# Patient Record
Sex: Female | Born: 1950 | Race: White | Hispanic: No | Marital: Married | Smoking: Never smoker
Health system: Southern US, Community
[De-identification: ages and names within clinical notes are randomized; demographics above are authoritative.]

## PROBLEM LIST (undated history)

## (undated) DIAGNOSIS — K635 Polyp of colon: Secondary | ICD-10-CM

## (undated) DIAGNOSIS — E785 Hyperlipidemia, unspecified: Secondary | ICD-10-CM

## (undated) DIAGNOSIS — I10 Essential (primary) hypertension: Secondary | ICD-10-CM

## (undated) DIAGNOSIS — Z8719 Personal history of other diseases of the digestive system: Secondary | ICD-10-CM

## (undated) DIAGNOSIS — J309 Allergic rhinitis, unspecified: Secondary | ICD-10-CM

## (undated) DIAGNOSIS — E559 Vitamin D deficiency, unspecified: Secondary | ICD-10-CM

## (undated) DIAGNOSIS — N950 Postmenopausal bleeding: Secondary | ICD-10-CM

## (undated) DIAGNOSIS — M069 Rheumatoid arthritis, unspecified: Secondary | ICD-10-CM

## (undated) DIAGNOSIS — N6099 Unspecified benign mammary dysplasia of unspecified breast: Secondary | ICD-10-CM

## (undated) DIAGNOSIS — K219 Gastro-esophageal reflux disease without esophagitis: Secondary | ICD-10-CM

## (undated) HISTORY — PX: HYSTEROSCOPY: SHX211

## (undated) HISTORY — PX: BREAST CYST ASPIRATION: SHX578

## (undated) HISTORY — PX: APPENDECTOMY: SHX54

## (undated) HISTORY — PX: TONSILLECTOMY: SUR1361

## (undated) HISTORY — DX: Unspecified benign mammary dysplasia of unspecified breast: N60.99

---

## 2005-11-02 ENCOUNTER — Ambulatory Visit: Payer: Self-pay | Admitting: Unknown Physician Specialty

## 2006-03-15 ENCOUNTER — Ambulatory Visit: Payer: Self-pay | Admitting: Gastroenterology

## 2006-05-15 ENCOUNTER — Ambulatory Visit: Payer: Self-pay | Admitting: Unknown Physician Specialty

## 2006-07-05 ENCOUNTER — Ambulatory Visit: Payer: Self-pay | Admitting: Gastroenterology

## 2006-11-18 ENCOUNTER — Ambulatory Visit: Payer: Self-pay | Admitting: Unknown Physician Specialty

## 2006-11-21 ENCOUNTER — Ambulatory Visit: Payer: Self-pay | Admitting: Unknown Physician Specialty

## 2007-01-14 ENCOUNTER — Ambulatory Visit: Payer: Self-pay | Admitting: Unknown Physician Specialty

## 2007-06-27 ENCOUNTER — Ambulatory Visit: Payer: Self-pay | Admitting: Unknown Physician Specialty

## 2007-10-01 ENCOUNTER — Ambulatory Visit: Payer: Self-pay | Admitting: Unknown Physician Specialty

## 2008-01-22 ENCOUNTER — Ambulatory Visit: Payer: Self-pay | Admitting: Unknown Physician Specialty

## 2009-01-31 ENCOUNTER — Ambulatory Visit: Payer: Self-pay | Admitting: Unknown Physician Specialty

## 2009-02-02 ENCOUNTER — Ambulatory Visit: Payer: Self-pay | Admitting: Unknown Physician Specialty

## 2009-08-08 ENCOUNTER — Ambulatory Visit: Payer: Self-pay | Admitting: Unknown Physician Specialty

## 2010-02-06 ENCOUNTER — Ambulatory Visit: Payer: Self-pay | Admitting: Unknown Physician Specialty

## 2010-11-29 ENCOUNTER — Ambulatory Visit: Payer: Self-pay | Admitting: Gastroenterology

## 2011-03-05 ENCOUNTER — Ambulatory Visit: Payer: Self-pay | Admitting: Unknown Physician Specialty

## 2011-07-26 ENCOUNTER — Ambulatory Visit: Payer: Self-pay | Admitting: Gastroenterology

## 2012-07-03 ENCOUNTER — Ambulatory Visit: Payer: Self-pay

## 2012-09-19 ENCOUNTER — Ambulatory Visit: Payer: Self-pay | Admitting: Gastroenterology

## 2012-09-23 LAB — PATHOLOGY REPORT

## 2013-08-19 ENCOUNTER — Ambulatory Visit: Payer: Self-pay

## 2013-08-26 ENCOUNTER — Ambulatory Visit: Payer: Self-pay

## 2014-08-16 ENCOUNTER — Other Ambulatory Visit: Payer: Self-pay | Admitting: Infectious Diseases

## 2014-08-16 DIAGNOSIS — Z1231 Encounter for screening mammogram for malignant neoplasm of breast: Secondary | ICD-10-CM

## 2014-08-24 ENCOUNTER — Ambulatory Visit
Admission: RE | Admit: 2014-08-24 | Discharge: 2014-08-24 | Disposition: A | Discharge status: Home / Self Care | Payer: BC Managed Care – PPO | Source: Ambulatory Visit | Attending: Infectious Diseases | Admitting: Infectious Diseases

## 2014-08-24 DIAGNOSIS — Z1231 Encounter for screening mammogram for malignant neoplasm of breast: Secondary | ICD-10-CM | POA: Diagnosis present

## 2015-09-12 ENCOUNTER — Other Ambulatory Visit: Payer: Self-pay | Admitting: Obstetrics and Gynecology

## 2015-09-12 DIAGNOSIS — Z1231 Encounter for screening mammogram for malignant neoplasm of breast: Secondary | ICD-10-CM

## 2015-09-30 ENCOUNTER — Other Ambulatory Visit: Payer: Self-pay | Admitting: Obstetrics and Gynecology

## 2015-09-30 ENCOUNTER — Ambulatory Visit
Admission: RE | Admit: 2015-09-30 | Discharge: 2015-09-30 | Disposition: A | Discharge status: Home / Self Care | Payer: BC Managed Care – PPO | Source: Ambulatory Visit | Attending: Obstetrics and Gynecology | Admitting: Obstetrics and Gynecology

## 2015-09-30 DIAGNOSIS — Z1231 Encounter for screening mammogram for malignant neoplasm of breast: Secondary | ICD-10-CM | POA: Diagnosis present

## 2015-10-12 ENCOUNTER — Other Ambulatory Visit: Payer: Self-pay | Admitting: Gastroenterology

## 2015-10-12 DIAGNOSIS — R1310 Dysphagia, unspecified: Secondary | ICD-10-CM

## 2015-10-21 ENCOUNTER — Ambulatory Visit
Admission: RE | Admit: 2015-10-21 | Discharge: 2015-10-21 | Disposition: A | Discharge status: Home / Self Care | Payer: BC Managed Care – PPO | Source: Ambulatory Visit | Attending: Gastroenterology | Admitting: Gastroenterology

## 2015-10-21 DIAGNOSIS — R1310 Dysphagia, unspecified: Secondary | ICD-10-CM | POA: Diagnosis present

## 2015-10-21 DIAGNOSIS — K449 Diaphragmatic hernia without obstruction or gangrene: Secondary | ICD-10-CM | POA: Insufficient documentation

## 2015-10-21 DIAGNOSIS — K225 Diverticulum of esophagus, acquired: Secondary | ICD-10-CM | POA: Diagnosis not present

## 2015-10-21 DIAGNOSIS — K219 Gastro-esophageal reflux disease without esophagitis: Secondary | ICD-10-CM | POA: Insufficient documentation

## 2015-12-16 ENCOUNTER — Encounter: Payer: Self-pay | Admitting: *Deleted

## 2015-12-16 ENCOUNTER — Ambulatory Visit: Payer: BC Managed Care – PPO | Admitting: Anesthesiology

## 2015-12-16 ENCOUNTER — Encounter
Admission: RE | Disposition: A | Discharge status: Home / Self Care | Payer: Self-pay | Source: Ambulatory Visit | Attending: Gastroenterology

## 2015-12-16 ENCOUNTER — Ambulatory Visit
Admission: RE | Admit: 2015-12-16 | Discharge: 2015-12-16 | Disposition: A | Discharge status: Home / Self Care | Payer: BC Managed Care – PPO | Source: Ambulatory Visit | Attending: Gastroenterology | Admitting: Gastroenterology

## 2015-12-16 DIAGNOSIS — N6019 Diffuse cystic mastopathy of unspecified breast: Secondary | ICD-10-CM | POA: Insufficient documentation

## 2015-12-16 DIAGNOSIS — M069 Rheumatoid arthritis, unspecified: Secondary | ICD-10-CM | POA: Insufficient documentation

## 2015-12-16 DIAGNOSIS — K5730 Diverticulosis of large intestine without perforation or abscess without bleeding: Secondary | ICD-10-CM | POA: Insufficient documentation

## 2015-12-16 DIAGNOSIS — K449 Diaphragmatic hernia without obstruction or gangrene: Secondary | ICD-10-CM | POA: Insufficient documentation

## 2015-12-16 DIAGNOSIS — K225 Diverticulum of esophagus, acquired: Secondary | ICD-10-CM | POA: Diagnosis not present

## 2015-12-16 DIAGNOSIS — K219 Gastro-esophageal reflux disease without esophagitis: Secondary | ICD-10-CM | POA: Insufficient documentation

## 2015-12-16 DIAGNOSIS — D125 Benign neoplasm of sigmoid colon: Secondary | ICD-10-CM | POA: Diagnosis not present

## 2015-12-16 DIAGNOSIS — R1310 Dysphagia, unspecified: Secondary | ICD-10-CM | POA: Diagnosis present

## 2015-12-16 DIAGNOSIS — J309 Allergic rhinitis, unspecified: Secondary | ICD-10-CM | POA: Diagnosis not present

## 2015-12-16 DIAGNOSIS — K640 First degree hemorrhoids: Secondary | ICD-10-CM | POA: Diagnosis not present

## 2015-12-16 DIAGNOSIS — E559 Vitamin D deficiency, unspecified: Secondary | ICD-10-CM | POA: Insufficient documentation

## 2015-12-16 DIAGNOSIS — I10 Essential (primary) hypertension: Secondary | ICD-10-CM | POA: Insufficient documentation

## 2015-12-16 DIAGNOSIS — Z86010 Personal history of colonic polyps: Secondary | ICD-10-CM | POA: Insufficient documentation

## 2015-12-16 DIAGNOSIS — K317 Polyp of stomach and duodenum: Secondary | ICD-10-CM | POA: Insufficient documentation

## 2015-12-16 DIAGNOSIS — K2950 Unspecified chronic gastritis without bleeding: Secondary | ICD-10-CM | POA: Insufficient documentation

## 2015-12-16 DIAGNOSIS — N950 Postmenopausal bleeding: Secondary | ICD-10-CM | POA: Insufficient documentation

## 2015-12-16 DIAGNOSIS — E785 Hyperlipidemia, unspecified: Secondary | ICD-10-CM | POA: Diagnosis not present

## 2015-12-16 HISTORY — DX: Vitamin D deficiency, unspecified: E55.9

## 2015-12-16 HISTORY — PX: COLONOSCOPY WITH PROPOFOL: SHX5780

## 2015-12-16 HISTORY — PX: ESOPHAGOGASTRODUODENOSCOPY (EGD) WITH PROPOFOL: SHX5813

## 2015-12-16 HISTORY — DX: Allergic rhinitis, unspecified: J30.9

## 2015-12-16 HISTORY — DX: Essential (primary) hypertension: I10

## 2015-12-16 HISTORY — DX: Rheumatoid arthritis, unspecified: M06.9

## 2015-12-16 HISTORY — DX: Polyp of colon: K63.5

## 2015-12-16 HISTORY — DX: Postmenopausal bleeding: N95.0

## 2015-12-16 HISTORY — DX: Hyperlipidemia, unspecified: E78.5

## 2015-12-16 SURGERY — ESOPHAGOGASTRODUODENOSCOPY (EGD) WITH PROPOFOL
Anesthesia: General

## 2015-12-16 MED ORDER — SODIUM CHLORIDE 0.9 % IV SOLN
INTRAVENOUS | Status: DC
  Administered 2015-12-16: 14:00:00 via INTRAVENOUS
  Administered 2015-12-16: 1000 mL via INTRAVENOUS

## 2015-12-16 MED ORDER — MIDAZOLAM HCL 5 MG/5ML IJ SOLN
INTRAMUSCULAR | Status: DC | PRN
  Administered 2015-12-16: 1 mg via INTRAVENOUS

## 2015-12-16 MED ORDER — PROPOFOL 10 MG/ML IV BOLUS
INTRAVENOUS | Status: DC | PRN
  Administered 2015-12-16: 80 mg via INTRAVENOUS

## 2015-12-16 MED ORDER — PHENYLEPHRINE HCL 10 MG/ML IJ SOLN
INTRAMUSCULAR | Status: DC | PRN
  Administered 2015-12-16 (×2): 100 ug via INTRAVENOUS

## 2015-12-16 MED ORDER — LIDOCAINE HCL (CARDIAC) 20 MG/ML IV SOLN
INTRAVENOUS | Status: DC | PRN
  Administered 2015-12-16: 100 mg via INTRAVENOUS

## 2015-12-16 MED ORDER — SODIUM CHLORIDE 0.9 % IV SOLN: INTRAVENOUS | Status: DC

## 2015-12-16 MED ORDER — PROPOFOL 500 MG/50ML IV EMUL
INTRAVENOUS | Status: DC | PRN
  Administered 2015-12-16: 180 ug/kg/min via INTRAVENOUS

## 2015-12-16 MED ORDER — FENTANYL CITRATE (PF) 100 MCG/2ML IJ SOLN
INTRAMUSCULAR | Status: DC | PRN
  Administered 2015-12-16: 50 ug via INTRAVENOUS

## 2015-12-16 NOTE — Op Note (Signed)
[facility] Gastroenterology Patient Name: Annette Bruce Procedure Date: 12/16/2015 2:04 PM MRN: [id number] Account #: [id number] Date of Birth: 12/14/1950 Admit Type: Outpatient Age: 65 Room: [facility] ENDO ROOM 4 Gender: Female Note Status: Finalized Procedure:            Upper GI endoscopy Indications:          Dysphagia Providers:            Lollie Sails, MD Referring MD:         Adrian Prows (Referring MD) Medicines:            Monitored Anesthesia Care Complications:        No immediate complications. Procedure:            Pre-Anesthesia Assessment:                       - ASA Grade Assessment: II - A patient with mild                        systemic disease.                       After obtaining informed consent, the endoscope was                        passed under direct vision. Throughout the procedure,                        the patient's blood pressure, pulse, and oxygen                        saturations were monitored continuously. The Endoscope                        was introduced through the mouth, and advanced to the                        fourth part of duodenum. The upper GI endoscopy was                        accomplished without difficulty. The patient tolerated                        the procedure well. 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      The Z-line was irregular. Biopsies were taken with a cold forceps for       histology.      A small hiatal hernia was present.      Patchy mild inflammation characterized by congestion (edema), erosions       and erythema was found in the gastric body and in the gastric antrum.       Biopsies were taken with a cold forceps for histology.      Multiple 1 to 4 mm sessile polyps with no bleeding and no stigmata of       recent bleeding were found in the gastric body. Biopsies were taken  with       a cold forceps for histology.      Patchy minimal inflammation characterized by  granularity was found in       the duodenal bulb. Impression:           - Zenker's diverticulum.                       - Z-line irregular. Biopsied.                       - Small hiatal hernia.                       - Gastritis. Biopsied.                       - Multiple gastric polyps. Biopsied. Recommendation:       - Continue present medications.                       - Return to GI clinic in 1 month. Procedure Code(s):    --- Professional ---                       [phone], Esophagogastroduodenoscopy, flexible, transoral;                        with biopsy, single or multiple Diagnosis Code(s):    --- Professional ---                       K22.5, Diverticulum of esophagus, acquired                       K22.8, Other specified diseases of esophagus                       K44.9, Diaphragmatic hernia without obstruction or                        gangrene                       K29.70, Gastritis, unspecified, without bleeding                       K31.7, Polyp of stomach and duodenum                       R13.10, Dysphagia, unspecified CPT copyright 2016 American Medical Association. All rights reserved. The codes documented in this report are preliminary and upon coder review may  be revised to meet current compliance requirements. Lollie Sails, MD 12/16/2015 2:34:51 PM This report has been signed electronically. Number of Addenda: 0 Note Initiated On: 12/16/2015 2:04 PM      [facility]

## 2015-12-16 NOTE — Transfer of Care (Signed)
Immediate Anesthesia Transfer of Care Note  Patient: Annette Bruce  Procedure(s) Performed: Procedure(s): ESOPHAGOGASTRODUODENOSCOPY (EGD) WITH PROPOFOL (N/A) COLONOSCOPY WITH PROPOFOL (N/A)  Patient Location: PACU  Anesthesia Type:General  Level of Consciousness: awake  Airway & Oxygen Therapy: Patient Spontanous Breathing and Patient connected to nasal cannula oxygen  Post-op Assessment: Report given to RN and Post -op Vital signs reviewed and stable  Post vital signs: Reviewed and stable  Last Vitals:  Vitals:   12/16/15 1319 12/16/15 1505  BP: (!) 147/75 (!) 92/50  Pulse: 71 68  Resp: 18 17  Temp: 36.8 C (!) 35.8 C    Last Pain:  Vitals:   12/16/15 1505  TempSrc: Tympanic  PainSc: Asleep         Complications: No apparent anesthesia complications

## 2015-12-16 NOTE — Op Note (Signed)
[facility] Gastroenterology Patient Name: Annette Bruce Procedure Date: 12/16/2015 1:58 PM MRN: [id number] Account #: [id number] Date of Birth: 1951-03-02 Admit Type: Outpatient Age: 65 Room: [facility] ENDO ROOM 4 Gender: Female Note Status: Finalized Procedure:            Colonoscopy Indications:          Personal history of colonic polyps Providers:            Lollie Sails, MD Referring MD:         Adrian Prows (Referring MD) Medicines:            Monitored Anesthesia Care Complications:        No immediate complications. Procedure:            Pre-Anesthesia Assessment:                       - ASA Grade Assessment: II - A patient with mild                        systemic disease.                       After obtaining informed consent, the colonoscope was                        passed under direct vision. Throughout the procedure,                        the patient's blood pressure, pulse, and oxygen                        saturations were monitored continuously. The                        Colonoscope was introduced through the anus and                        advanced to the the cecum, identified by appendiceal                        orifice and ileocecal valve. The colonoscopy was                        performed without difficulty. The patient tolerated the                        procedure well. The quality of the bowel preparation                        was good. Findings:      A few small-mouthed diverticula were found in the sigmoid colon,       descending colon, transverse colon and ascending colon.      Two sessile polyps were found in the mid sigmoid colon. The polyps were       less than 1 mm in size. These polyps were removed with a cold biopsy       forceps. Resection and retrieval were complete.      A tattoo was seen in the descending colon. The tattoo site appeared       normal.      The retroflexed view of the  distal rectum and anal verge  was normal and       showed no anal or rectal abnormalities.      The digital rectal exam was normal.      Non-bleeding internal hemorrhoids were found during anoscopy. The       hemorrhoids were small and Grade I (internal hemorrhoids that do not       prolapse). Impression:           - Diverticulosis in the sigmoid colon, in the                        descending colon, in the transverse colon and in the                        ascending colon.                       - Two less than 1 mm polyps in the mid sigmoid colon,                        removed with a cold biopsy forceps. Resected and                        retrieved.                       - A tattoo was seen in the descending colon. The tattoo                        site appeared normal.                       - The distal rectum and anal verge are normal on                        retroflexion view. Recommendation:       - Discharge patient to home.                       - Await pathology results.                       - Telephone GI clinic for pathology results in 1 week.                       - Discharge patient to home. Procedure Code(s):    --- Professional ---                       [phone], Colonoscopy, flexible; with biopsy, single or                        multiple Diagnosis Code(s):    --- Professional ---                       D12.5, Benign neoplasm of sigmoid colon                       Z86.010, Personal history of colonic polyps                       K57.30, Diverticulosis of large intestine without  perforation or abscess without bleeding CPT copyright 2016 American Medical Association. All rights reserved. The codes documented in this report are preliminary and upon coder review may  be revised to meet current compliance requirements. Lollie Sails, MD 12/16/2015 3:02:31 PM This report has been signed electronically. Number of Addenda: 0 Note Initiated On: 12/16/2015 1:58 PM Scope Withdrawal  Time: 0 hours 8 minutes 11 seconds  Total Procedure Duration: 0 hours 19 minutes 18 seconds       [facility]

## 2015-12-16 NOTE — H&P (Signed)
Outpatient short stay form Pre-procedure 12/16/2015 2:07 PM Lollie Sails MD  Primary Physician: Dr. Adrian Prows  Reason for visit:  EGD and colonoscopy  History of present illness:  Patient is a 65 year old female presenting today as above. She has personal history of adenomatous colon polyps. She also has some complaint of dysphagia however she has a known history of Zenker's diverticulum. A barium swallow was done on 10/21/2015 that showed slight increase in the size of the Zenker's diverticulum about 1 cm in size or so, there is poor peristalsis in a prone position, there is a small reducible hiatal hernia with some GE reflux. There is evidence of possible narrowing at the GE junction however this was not obstructive or catching to the barium tablet.    Current Facility-Administered Medications:  .  0.9 %  sodium chloride infusion, , Intravenous, Continuous, Lollie Sails, MD, Last Rate: 20 mL/hr at 12/16/15 1339, 1,000 mL at 12/16/15 1339 .  0.9 %  sodium chloride infusion, , Intravenous, Continuous, Lollie Sails, MD  Prescriptions Prior to Admission  Medication Sig Dispense Refill Last Dose  . Adalimumab (HUMIRA PEN) 40 MG/0.8ML PNKT Inject into the skin every 14 (fourteen) days.   Past Week at Unknown time  . diclofenac (VOLTAREN) 75 MG EC tablet Take 75 mg by mouth 2 (two) times daily.   Past Month at Unknown time  . esomeprazole (NEXIUM) 40 MG capsule Take 40 mg by mouth daily at 12 noon.   12/15/2015 at Unknown time  . folic acid (FOLVITE) 1 MG tablet Take 1 mg by mouth daily.   12/15/2015 at Unknown time  . hydrochlorothiazide (HYDRODIURIL) 25 MG tablet Take 25 mg by mouth daily.   12/15/2015 at Unknown time  . methotrexate (50 MG/ML) 1 g injection Inject 20 mg into the vein once a week.   Past Week at Unknown time  . hyoscyamine (LEVSIN, ANASPAZ) 0.125 MG tablet Take 0.125 mg by mouth every 4 (four) hours as needed.   Not Taking at Unknown time  . triamcinolone  ointment (KENALOG) 0.1 % Apply 1 application topically as needed.   Not Taking at Unknown time     Allergies  Allergen Reactions  . Doxycycline Hives     Past Medical History:  Diagnosis Date  . Allergic rhinitis   . Colon polyps   . Fibrocystic breast disease   . Hyperlipemia   . Hypertension   . Postmenopausal bleeding   . Rheumatoid arthritis ([place])   . Vitamin D deficiency     Review of systems:      Physical Exam    Heart and lungs: Regular rate and rhythm without rub or gallop, lungs are bilaterally clear.    HEENT: Normocephalic atraumatic eyes are anicteric    Other:     Pertinant exam for procedure: Soft nontender nondistended bowel sounds positive normoactive.    Planned proceedures: EGD, colonoscopy and indicated procedures. I have discussed the risks benefits and complications of procedures to include not limited to bleeding, infection, perforation and the risk of sedation and the patient wishes to proceed.    Lollie Sails, MD Gastroenterology 12/16/2015  2:07 PM

## 2015-12-16 NOTE — Anesthesia Preprocedure Evaluation (Signed)
Anesthesia Evaluation  Patient identified by MRN, date of birth, ID band Patient awake    Reviewed: Allergy & Precautions, H&P , NPO status , Patient's Chart, lab work & pertinent test results, reviewed documented beta blocker date and time   History of Anesthesia Complications Negative for: history of anesthetic complications  Airway Mallampati: III  TM Distance: >3 FB Neck ROM: full    Dental no notable dental hx. (+) Caps   Pulmonary neg pulmonary ROS,    Pulmonary exam normal breath sounds clear to auscultation       Cardiovascular Exercise Tolerance: Good hypertension, (-) angina(-) CAD, (-) Past MI, (-) Cardiac Stents and (-) CABG Normal cardiovascular exam(-) dysrhythmias (-) Valvular Problems/Murmurs Rhythm:regular Rate:Normal     Neuro/Psych negative neurological ROS  negative psych ROS   GI/Hepatic Neg liver ROS, GERD  ,  Endo/Other  negative endocrine ROS  Renal/GU negative Renal ROS  negative genitourinary   Musculoskeletal   Abdominal   Peds  Hematology negative hematology ROS (+)   Anesthesia Other Findings Past Medical History: No date: Allergic rhinitis No date: Colon polyps No date: Fibrocystic breast disease No date: Hyperlipemia No date: Hypertension No date: Postmenopausal bleeding No date: Rheumatoid arthritis ([place]) No date: Vitamin D deficiency   Reproductive/Obstetrics negative OB ROS                             Anesthesia Physical Anesthesia Plan  ASA: II  Anesthesia Plan: General   Post-op Pain Management:    Induction:   Airway Management Planned:   Additional Equipment:   Intra-op Plan:   Post-operative Plan:   Informed Consent: I have reviewed the patients History and Physical, chart, labs and discussed the procedure including the risks, benefits and alternatives for the proposed anesthesia with the patient or authorized representative who  has indicated his/her understanding and acceptance.   Dental Advisory Given  Plan Discussed with: Anesthesiologist, CRNA and Surgeon  Anesthesia Plan Comments:         Anesthesia Quick Evaluation

## 2015-12-19 ENCOUNTER — Encounter: Payer: Self-pay | Admitting: Gastroenterology

## 2015-12-19 NOTE — Anesthesia Postprocedure Evaluation (Signed)
Anesthesia Post Note  Patient: MALEEYAH SPRITZER  Procedure(s) Performed: Procedure(s) (LRB): ESOPHAGOGASTRODUODENOSCOPY (EGD) WITH PROPOFOL (N/A) COLONOSCOPY WITH PROPOFOL (N/A)  Patient location during evaluation: Endoscopy Anesthesia Type: General Level of consciousness: awake and alert Pain management: pain level controlled Vital Signs Assessment: post-procedure vital signs reviewed and stable Respiratory status: spontaneous breathing, nonlabored ventilation, respiratory function stable and patient connected to nasal cannula oxygen Cardiovascular status: blood pressure returned to baseline and stable Postop Assessment: no signs of nausea or vomiting Anesthetic complications: no    Last Vitals:  Vitals:   12/16/15 1525 12/16/15 1535  BP: 124/69 130/71  Pulse: 60 61  Resp: 18 18  Temp:      Last Pain:  Vitals:   12/16/15 1505  TempSrc: Tympanic  PainSc: Asleep                 Martha Clan

## 2015-12-20 LAB — SURGICAL PATHOLOGY

## 2016-01-27 ENCOUNTER — Encounter: Payer: Self-pay | Admitting: Physician Assistant

## 2016-01-27 ENCOUNTER — Ambulatory Visit: Payer: Self-pay | Admitting: Physician Assistant

## 2016-01-27 VITALS — BP 142/90 | HR 80 | Temp 98.6°F

## 2016-01-27 DIAGNOSIS — R300 Dysuria: Secondary | ICD-10-CM

## 2016-01-27 LAB — POCT URINALYSIS DIPSTICK
Bilirubin, UA: NEGATIVE
Blood, UA: NEGATIVE
GLUCOSE UA: NEGATIVE
Ketones, UA: NEGATIVE
LEUKOCYTES UA: NEGATIVE
Nitrite, UA: NEGATIVE
PROTEIN UA: NEGATIVE
Spec Grav, UA: >=1.03
UROBILINOGEN UA: 0.2
pH, UA: 5

## 2016-01-27 NOTE — Progress Notes (Signed)
S: would like to get her urine checked, takes humera and methotrexate injections and wanted to make sure her uti is gone, was given macrobid  O: vitals wnl, nad, ua wnl  A: well adult  P: f/u with rheumatology

## 2016-02-07 DIAGNOSIS — Z87440 Personal history of urinary (tract) infections: Secondary | ICD-10-CM | POA: Insufficient documentation

## 2016-04-05 ENCOUNTER — Ambulatory Visit: Payer: Self-pay | Admitting: Physician Assistant

## 2016-04-05 ENCOUNTER — Encounter: Payer: Self-pay | Admitting: Physician Assistant

## 2016-04-05 VITALS — BP 140/98 | HR 72 | Temp 98.0°F

## 2016-04-05 DIAGNOSIS — B370 Candidal stomatitis: Secondary | ICD-10-CM

## 2016-04-05 NOTE — Progress Notes (Signed)
S: mouth is dry and burning , ?if from medications, no fever/chills/cough/congestion, did have some sinus stuffiness but no colored mucus, husband states she is snoring more than usual, no hx diabetes, no hx recent abntibiotic, pt has RA is on humera and methotrexate  O: vitals wnl nad, tms clear, nasal mucosa swollen, pinkish/red, mouth without lesions, tongue is white, throat wnl, neck supple no lymph, lungs c t a, cv rrr  A: dry mouth, ?yeast  P: dukes magic mouthwash, f/u prn

## 2016-04-20 ENCOUNTER — Ambulatory Visit: Payer: Self-pay | Admitting: Physician Assistant

## 2016-04-20 VITALS — BP 129/80 | HR 63 | Temp 97.7°F

## 2016-04-20 DIAGNOSIS — K644 Residual hemorrhoidal skin tags: Secondary | ICD-10-CM

## 2016-04-20 MED ORDER — HYDROCORTISONE 1 % EX CREA: 1.0000 "application " | TOPICAL_CREAM | Freq: Two times a day (BID) | CUTANEOUS | 0 refills | Status: DC

## 2016-04-20 MED ORDER — DOCUSATE SODIUM 100 MG PO CAPS: 100.0000 mg | ORAL_CAPSULE | Freq: Two times a day (BID) | ORAL | 0 refills | Status: DC

## 2016-04-20 NOTE — Progress Notes (Signed)
   Subjective:    Patient ID: Annette Bruce, female    DOB: 1950/09/04, 66 y.o.   MRN: [id number]  HPI Patient c/o small protuding hemorrhoidal tissue for one week. Patient notice mild bleeding today with defecation.  States mild constipation.No palliative measures for compliant.   Review of Systems   negative except for compliant.  Objective:   Physical Exam Deferred by patient request.       Assessment & Plan:Hemorrhoid  Trial of Proctocort, Sitz bath, and Colace. Return if no improvement.

## 2016-04-23 ENCOUNTER — Encounter: Payer: Self-pay | Admitting: Physician Assistant

## 2016-08-06 ENCOUNTER — Ambulatory Visit: Payer: Self-pay | Admitting: Physician Assistant

## 2016-08-06 ENCOUNTER — Encounter: Payer: Self-pay | Admitting: Physician Assistant

## 2016-08-06 VITALS — BP 130/90 | HR 66 | Temp 98.1°F

## 2016-08-06 DIAGNOSIS — B029 Zoster without complications: Secondary | ICD-10-CM

## 2016-08-06 MED ORDER — FAMCICLOVIR 500 MG PO TABS: 500.0000 mg | ORAL_TABLET | Freq: Three times a day (TID) | ORAL | 0 refills | Status: DC

## 2016-08-06 NOTE — Progress Notes (Signed)
S: c/o rash on back of neck, spreading into her hair and to r side of head, had humera last Monday and methotrexate shot last thurs, noticed rash last night, tingles and burns a little, denies fever/chills  O: vitals wnl, nad, skin with raised red area in patches typical of shingles, no vesicles or drainage, neck supple + large node posterior cervical chain, lungs c t a, cv rrr  A: shingles  P: famvir, medrol dose pack

## 2016-08-06 NOTE — Patient Instructions (Addendum)
Shingles Shingles is an infection that causes a painful skin rash and fluid-filled blisters. Shingles is caused by the same virus that causes chickenpox. Shingles only develops in people who:  Have had chickenpox.  Have gotten the chickenpox vaccine. (This is rare.) The first symptoms of shingles may be itching, tingling, or pain in an area on your skin. A rash will follow in a few days or weeks. The rash is usually on one side of the body in a bandlike or beltlike pattern. Over time, the rash turns into fluid-filled blisters that break open, scab over, and dry up. Medicines may:  Help you manage pain.  Help you recover more quickly.  Help to prevent long-term problems. Follow these instructions at home: Medicines  Take medicines only as told by your doctor.  Apply an anti-itch or numbing cream to the affected area as told by your doctor. Blister and Rash Care  Take a cool bath or put cool compresses on the area of the rash or blisters as told by your doctor. This may help with pain and itching.  Keep your rash covered with a loose bandage (dressing). Wear loose-fitting clothing.  Keep your rash and blisters clean with mild soap and cool water or as told by your doctor.  Check your rash every day for signs of infection. These include redness, swelling, and pain that lasts or gets worse.  Do not pick your blisters.  Do not scratch your rash. General instructions  Rest as told by your doctor.  Keep all follow-up visits as told by your doctor. This is important.  Until your blisters scab over, your infection can cause chickenpox in people who have never had it or been vaccinated against it. To prevent this from happening, avoid touching other people or being around other people, especially:  Babies.  Pregnant women.  Children who have eczema.  Elderly people who have transplants.  People who have chronic illnesses, such as leukemia or AIDS. Contact a doctor if:  Your  pain does not get better with medicine.  Your pain does not get better after the rash heals.  Your rash looks infected. Signs of infection include:  Redness.  Swelling.  Pain that lasts or gets worse. Get help right away if:  The rash is on your face or nose.  You have pain in your face, pain around your eye area, or loss of feeling on one side of your face.  You have ear pain or you have ringing in your ear.  You have loss of taste.  Your condition gets worse. This information is not intended to replace advice given to you by your health care provider. Make sure you discuss any questions you have with your health care provider. Document Released: 09/05/2007 Document Revised: 11/13/2015 Document Reviewed: 12/29/2013 Elsevier Interactive Patient Education  2017 Elsevier Inc.  

## 2016-08-08 DIAGNOSIS — K219 Gastro-esophageal reflux disease without esophagitis: Secondary | ICD-10-CM | POA: Insufficient documentation

## 2016-08-15 ENCOUNTER — Encounter: Payer: Self-pay | Admitting: Physician Assistant

## 2016-08-15 ENCOUNTER — Ambulatory Visit: Payer: Self-pay | Admitting: Physician Assistant

## 2016-08-15 VITALS — BP 140/90 | HR 88 | Temp 98.2°F

## 2016-08-15 DIAGNOSIS — J209 Acute bronchitis, unspecified: Secondary | ICD-10-CM

## 2016-08-15 MED ORDER — IPRATROPIUM-ALBUTEROL 0.5-2.5 (3) MG/3ML IN SOLN: 3.0000 mL | Freq: Once | RESPIRATORY_TRACT | Status: DC

## 2016-08-15 MED ORDER — ALBUTEROL SULFATE HFA 108 (90 BASE) MCG/ACT IN AERS: 2.0000 | INHALATION_SPRAY | Freq: Four times a day (QID) | RESPIRATORY_TRACT | 0 refills | Status: DC | PRN

## 2016-08-15 NOTE — Progress Notes (Signed)
S: C/o cough and congestion with wheezing and chest pain, chest is sore from coughing, denies fever, chills; cough is dry and hacking; keeping pt awake at night; did start having diarrhea yesterday, is watery, started on a zpack and nystatin 2 days ago, ?if from the medication;  denies cardiac type chest pain or sob, v/d, abd pain Remainder ros neg; states the shingles are much better  O: vitals wnl, nad, tms clear, throat injected, neck supple no lymph, lungs with wheezing, cv rrr, neuro intact, svn duoneb  A:  Acute bronchitis   P:  rx medication:  Albuterol inhaler;  use otc meds, tylenol or motrin as needed for fever/chills, return if not better in 3 -5 days, return earlier if worsening, stop zpack

## 2016-09-04 ENCOUNTER — Encounter: Payer: Self-pay | Admitting: Physician Assistant

## 2016-09-04 ENCOUNTER — Ambulatory Visit: Payer: Self-pay | Admitting: Physician Assistant

## 2016-09-04 VITALS — BP 160/90 | HR 66 | Temp 97.9°F

## 2016-09-04 DIAGNOSIS — B029 Zoster without complications: Secondary | ICD-10-CM

## 2016-09-04 NOTE — Progress Notes (Signed)
S: concerned that she has shingles, has rx for valcyclovir from her doctor, was to use if she broke out again, has 3 bumps on her back that are burning and tingling, itching a little, no fever/chills, hasn't had her humera in over a month  O: vitals wnl, nad, skin on back under bra strap with 3 erythematous based lesions typical of shingles, no drainage, n/v intact  A: shingles  P: call pcp to discuss

## 2016-11-13 ENCOUNTER — Other Ambulatory Visit: Payer: Self-pay | Admitting: Obstetrics and Gynecology

## 2016-11-13 DIAGNOSIS — Z1231 Encounter for screening mammogram for malignant neoplasm of breast: Secondary | ICD-10-CM

## 2016-11-29 ENCOUNTER — Ambulatory Visit
Admission: RE | Admit: 2016-11-29 | Discharge: 2016-11-29 | Disposition: A | Discharge status: Home / Self Care | Payer: BC Managed Care – PPO | Source: Ambulatory Visit | Attending: Obstetrics and Gynecology | Admitting: Obstetrics and Gynecology

## 2016-11-29 DIAGNOSIS — Z1231 Encounter for screening mammogram for malignant neoplasm of breast: Secondary | ICD-10-CM | POA: Insufficient documentation

## 2016-12-14 ENCOUNTER — Encounter: Payer: Self-pay | Admitting: Physician Assistant

## 2016-12-14 ENCOUNTER — Ambulatory Visit: Payer: Self-pay | Admitting: Physician Assistant

## 2016-12-14 VITALS — BP 150/94 | HR 68 | Temp 98.8°F | Resp 16

## 2016-12-14 DIAGNOSIS — B9689 Other specified bacterial agents as the cause of diseases classified elsewhere: Secondary | ICD-10-CM

## 2016-12-14 DIAGNOSIS — L089 Local infection of the skin and subcutaneous tissue, unspecified: Principal | ICD-10-CM

## 2016-12-14 MED ORDER — CLINDAMYCIN HCL 150 MG PO CAPS: 150.0000 mg | ORAL_CAPSULE | Freq: Four times a day (QID) | ORAL | 0 refills | Status: DC

## 2016-12-14 MED ORDER — HYDROCORTISONE VALERATE 0.2 % EX OINT: 1.0000 "application " | TOPICAL_OINTMENT | Freq: Two times a day (BID) | CUTANEOUS | 0 refills | Status: DC

## 2016-12-14 NOTE — Progress Notes (Signed)
   Subjective:Rash    Patient ID: Annette Bruce, female    DOB: Apr 30, 1950, 66 y.o.   MRN: [id number]  HPI Patient c/o rash left axillary area for 2 days. Believes it is a reaction to Flu shot take 2 days ago. Further history revealed itching before Flu shot.    Review of Systems Negative except for compliant.    Objective:   Physical Exam Papular lesions bilateral axillary area on erythematous base. Recent signs of shaving. Left axillary adenopathy.       Assessment & Plan:skin infection  Cleocin and Westcort as directed. Follow up one week if no improvement.

## 2017-10-21 ENCOUNTER — Other Ambulatory Visit: Payer: Self-pay | Admitting: Obstetrics and Gynecology

## 2017-10-21 DIAGNOSIS — Z1231 Encounter for screening mammogram for malignant neoplasm of breast: Secondary | ICD-10-CM

## 2017-12-06 ENCOUNTER — Ambulatory Visit
Admission: RE | Admit: 2017-12-06 | Discharge: 2017-12-06 | Disposition: A | Discharge status: Home / Self Care | Payer: Medicare Other | Source: Ambulatory Visit | Attending: Obstetrics and Gynecology | Admitting: Obstetrics and Gynecology

## 2017-12-06 DIAGNOSIS — Z1231 Encounter for screening mammogram for malignant neoplasm of breast: Secondary | ICD-10-CM | POA: Diagnosis not present

## 2017-12-20 ENCOUNTER — Other Ambulatory Visit: Payer: Self-pay | Admitting: Obstetrics and Gynecology

## 2017-12-20 DIAGNOSIS — N6320 Unspecified lump in the left breast, unspecified quadrant: Secondary | ICD-10-CM

## 2017-12-20 DIAGNOSIS — R928 Other abnormal and inconclusive findings on diagnostic imaging of breast: Secondary | ICD-10-CM

## 2017-12-26 ENCOUNTER — Ambulatory Visit
Admission: RE | Admit: 2017-12-26 | Discharge: 2017-12-26 | Disposition: A | Discharge status: Home / Self Care | Payer: Medicare Other | Source: Ambulatory Visit | Attending: Obstetrics and Gynecology | Admitting: Obstetrics and Gynecology

## 2017-12-26 DIAGNOSIS — N6320 Unspecified lump in the left breast, unspecified quadrant: Secondary | ICD-10-CM | POA: Insufficient documentation

## 2017-12-26 DIAGNOSIS — R928 Other abnormal and inconclusive findings on diagnostic imaging of breast: Secondary | ICD-10-CM | POA: Diagnosis present

## 2017-12-30 ENCOUNTER — Other Ambulatory Visit: Payer: Self-pay | Admitting: Obstetrics and Gynecology

## 2017-12-30 DIAGNOSIS — R928 Other abnormal and inconclusive findings on diagnostic imaging of breast: Secondary | ICD-10-CM

## 2018-01-02 ENCOUNTER — Ambulatory Visit
Admission: RE | Admit: 2018-01-02 | Discharge: 2018-01-02 | Disposition: A | Discharge status: Home / Self Care | Payer: Medicare Other | Source: Ambulatory Visit | Attending: Obstetrics and Gynecology | Admitting: Obstetrics and Gynecology

## 2018-01-02 DIAGNOSIS — R928 Other abnormal and inconclusive findings on diagnostic imaging of breast: Secondary | ICD-10-CM

## 2018-01-02 HISTORY — PX: BREAST BIOPSY: SHX20

## 2018-01-06 LAB — SURGICAL PATHOLOGY

## 2018-01-07 ENCOUNTER — Encounter: Payer: Self-pay | Admitting: *Deleted

## 2018-01-07 DIAGNOSIS — N6092 Unspecified benign mammary dysplasia of left breast: Secondary | ICD-10-CM

## 2018-01-07 NOTE — Progress Notes (Signed)
  Oncology Nurse Navigator Documentation  Navigator Location: [facility] (01/07/18 1500)   )Navigator Encounter Type: Telephone (01/07/18 1500) Telephone: Lahoma Crocker Call (01/07/18 1500)                       Barriers/Navigation Needs: Coordination of Care (01/07/18 1500)   Interventions: Coordination of Care (01/07/18 1500)   Coordination of Care: Appts (01/07/18 1500)                  Time Spent with Patient: 45 (01/07/18 1500)   Notified of abnormal left breast biopsy and need for surgical consult by Eino Farber, at the [facility].  Per protocol patient has been scheduled to see Dr. Bary Castilla on 01/09/18.  Dr. Migdalia Dk office notified of appointment.

## 2018-01-09 ENCOUNTER — Ambulatory Visit: Payer: Medicare Other | Admitting: General Surgery

## 2018-01-09 ENCOUNTER — Ambulatory Visit: Payer: Self-pay

## 2018-01-09 ENCOUNTER — Encounter: Payer: Self-pay | Admitting: General Surgery

## 2018-01-09 VITALS — BP 132/70 | HR 74 | Resp 14 | Ht 69.0 in | Wt 225.0 lb

## 2018-01-09 DIAGNOSIS — N6092 Unspecified benign mammary dysplasia of left breast: Secondary | ICD-10-CM

## 2018-01-09 DIAGNOSIS — N6323 Unspecified lump in the left breast, lower outer quadrant: Secondary | ICD-10-CM | POA: Diagnosis not present

## 2018-01-09 NOTE — Patient Instructions (Addendum)
The patient is aware to call back for any questions or new concerns.  Discussed excisional left breast biopsy.

## 2018-01-09 NOTE — Progress Notes (Signed)
Patient ID: Annette Bruce, female   DOB: 02/22/1951, 67 y.o.   MRN: [id number]  Chief Complaint  Patient presents with  . Breast Problem    HPI Annette Bruce is a 67 y.o. female who presents for a breast evaluation. The most recent mammogram was done on 12/06/2017 added views on 12/26/2017 , left breast biopsy done on 01/02/2018.  Patient does not perform regular self breast checks and gets regular mammograms done. She was unaware of any breast issues prior to the mammogram.   She works for [employer].  Bra 46 DDD  HPI  Past Medical History:  Diagnosis Date  . Allergic rhinitis   . Colon polyps   . Fibrocystic breast disease   . Hyperlipemia   . Hypertension   . Postmenopausal bleeding   . Rheumatoid arthritis ([place])    Dr Jefm Bryant  . Vitamin D deficiency     Past Surgical History:  Procedure Laterality Date  . APPENDECTOMY    . BREAST BIOPSY Left 01/02/2018   x marker, LOQ, CYSTIC PAPILLARY APOCRINE METAPLASIA.   Marland Kitchen BREAST BIOPSY Left 01/02/2018   coil marker, UIQ, ATYPICAL LOBULAR HYPERPLASIA, CYSTIC PAPILLARY APOCRINE METAPLASIA  . BREAST CYST ASPIRATION Left 1970's   negative  . COLONOSCOPY WITH PROPOFOL N/A 12/16/2015   Procedure: COLONOSCOPY WITH PROPOFOL;  Surgeon: Lollie Sails, MD;  Location: [facility] ENDOSCOPY;  Service: Endoscopy;  Laterality: N/A;  . ESOPHAGOGASTRODUODENOSCOPY (EGD) WITH PROPOFOL N/A 12/16/2015   Procedure: ESOPHAGOGASTRODUODENOSCOPY (EGD) WITH PROPOFOL;  Surgeon: Lollie Sails, MD;  Location: [facility] ENDOSCOPY;  Service: Endoscopy;  Laterality: N/A;  . TONSILLECTOMY      Family History  Problem Relation Age of Onset  . Colon cancer Mother 36  . Breast cancer Sister 37  . Breast cancer Maternal Aunt 70    Social History Social History   Tobacco Use  . Smoking status: Never Smoker  . Smokeless tobacco: Never Used  Substance Use Topics  . Alcohol use: No  . Drug use: No    Allergies  Allergen Reactions  . Doxycycline  Hives    Current Outpatient Medications  Medication Sig Dispense Refill  . Adalimumab (HUMIRA PEN) 40 MG/0.8ML PNKT Inject into the skin every 14 (fourteen) days.    . diclofenac (VOLTAREN) 75 MG EC tablet Take 75 mg by mouth daily as needed for moderate pain.     Marland Kitchen esomeprazole (NEXIUM) 40 MG capsule Take 40 mg by mouth daily at 12 noon.    . hydrochlorothiazide (HYDRODIURIL) 25 MG tablet Take 25 mg by mouth daily.    . simvastatin (ZOCOR) 20 MG tablet Take 20 mg by mouth daily.     Marland Kitchen triamcinolone ointment (KENALOG) 0.1 % Apply 1 application topically daily as needed (irritation).     . fluticasone (FLONASE) 50 MCG/ACT nasal spray Place 2 sprays into both nostrils daily as needed for allergies or rhinitis.     No current facility-administered medications for this visit.     Review of Systems Review of Systems  Constitutional: Negative.   Respiratory: Negative.   Cardiovascular: Negative.     Blood pressure 132/70, pulse 74, resp. rate 14, height 5\' 9"  (1.753 m), weight 225 lb (102.1 kg).  Physical Exam Physical Exam  Constitutional: She is oriented to person, place, and time. She appears well-developed and well-nourished.  HENT:  Mouth/Throat: Oropharynx is clear and moist.  Eyes: Conjunctivae are normal. No scleral icterus.  Neck: Neck supple.  Cardiovascular: Normal rate, regular rhythm and normal heart  sounds.  Pulmonary/Chest: Effort normal and breath sounds normal. Right breast exhibits no inverted nipple, no mass, no nipple discharge, no skin change and no tenderness. Left breast exhibits no inverted nipple, no mass, no nipple discharge, no skin change and no tenderness.  Lymphadenopathy:    She has no cervical adenopathy.    She has no axillary adenopathy.  Neurological: She is alert and oriented to person, place, and time.  Skin: Skin is warm and dry.  Psychiatric: Her behavior is normal.    Data Reviewed DIAGNOSIS:  A. BREAST, LEFT LOWER OUTER; STEREOTACTIC  BIOPSY:  - DUCT ECTASIA.  - SCLEROSING ADENOSIS WITH CALCIFICATION.  - CYSTIC PAPILLARY APOCRINE METAPLASIA.  - DEEPER SECTIONS EXAMINED.  - NEGATIVE FOR ATYPIA AND MALIGNANCY.   B. BREAST, LEFT UPPER INNER; STEREOTACTIC BIOPSY:  - SCLEROSING ADENOSIS AND COLUMNAR CELL CHANGE WITH ASSOCIATED  CALCIFICATIONS.  - CYSTIC PAPILLARY APOCRINE METAPLASIA.  - USUAL DUCTAL HYPERPLASIA.  - ATYPICAL LOBULAR HYPERPLASIA.  - FAT NECROSIS.  - DEEPER SECTIONS EXAMINED.  - NEGATIVE FOR MALIGNANCY.  COIL CLIP placed.   Breast mammograms and ultrasound examinations  from June 2017 through January 07, 2018 reviewed as well as associated reports.  In particular, post biopsy recommendations from the radiologist on January 07, 2018 reviewed.  Ultrasound examination of the breast was undertaken to determine if preoperative wire localization would be required.  In the 1030 o'clock position of the left breast, 7 cm from the nipple irregular mass consistent with a biopsy cavity measuring 1.5 x 1.59 x 1.73 cm is appreciated.  This is about 1.5 cm below the skin surface.  This is the area of atypical lobular hyperplasia.  The 4:00 biopsy site, 8 cm from the nipple shows a clearly defined biopsy cavity measuring 0.91 x 1.28 x 1.56 cm.  Incidentally noted at the 5 o'clock position of the left breast, 6 cm from the nipple is a irregular hypoechoic mass measuring 0.76 x 0.69 x 0.78 cm.  Dense acoustic shadowing is noted.  This does not have a mammographic correlate on review of the films.  BI-RADS-4.  Assessment    The area with atypical lobular hyperplasia in the upper inner quadrant of the left breast warrants reexcision.  Considering the volume of tissue removed from the 4:00 site and the absence of any atypia, observation alone will be warranted.  The 5:00 site warrants core biopsy due to its atypical appearance.    Plan       Discussed excisional left breast biopsy.  The patient is aware to call back  for any questions or new concerns.      HPI, Physical Exam, Assessment and Plan have been scribed under the direction and in the presence of Robert Bellow, MD. Karie Fetch, RN I have completed the exam and reviewed the above documentation for accuracy and completeness.  I agree with the above.  Haematologist has been used and any errors in dictation or transcription are unintentional.  Hervey Ard, M.D., F.A.C.S.   Forest Gleason Meriem Lemieux 01/10/2018, 4:08 PM  Patient's surgery has been scheduled for 01-20-18 at [facility] with Dr. Bary Castilla.  The patient is aware to call the office should they have further questions.   Dominga Ferry, CMA

## 2018-01-10 DIAGNOSIS — N6323 Unspecified lump in the left breast, lower outer quadrant: Secondary | ICD-10-CM | POA: Insufficient documentation

## 2018-01-10 DIAGNOSIS — N6092 Unspecified benign mammary dysplasia of left breast: Secondary | ICD-10-CM | POA: Insufficient documentation

## 2018-01-15 ENCOUNTER — Other Ambulatory Visit: Payer: Self-pay

## 2018-01-15 ENCOUNTER — Encounter
Admission: RE | Admit: 2018-01-15 | Discharge: 2018-01-15 | Disposition: A | Discharge status: Home / Self Care | Payer: Medicare Other | Source: Ambulatory Visit | Attending: General Surgery | Admitting: General Surgery

## 2018-01-15 HISTORY — DX: Personal history of other diseases of the digestive system: Z87.19

## 2018-01-15 HISTORY — DX: Gastro-esophageal reflux disease without esophagitis: K21.9

## 2018-01-15 NOTE — Patient Instructions (Signed)
Your procedure is scheduled on: 01-20-18 MONDAY Report to Same Day Surgery 2nd floor medical mall [facility] Entrance-take elevator on left to 2nd floor.  Check in with surgery information desk.) To find out your arrival time please call [phone] between 1PM - 3PM on 01-17-18 FRIDAY  Remember: Instructions that are not followed completely may result in serious medical risk, up to and including death, or upon the discretion of your surgeon and anesthesiologist your surgery may need to be rescheduled.    _x___ 1. Do not eat food after midnight the night before your procedure. NO GUM OR CANDY AFTER MIDNIGHT.  You may drink clear liquids up to 2 hours before you are scheduled to arrive at the hospital for your procedure.  Do not drink clear liquids within 2 hours of your scheduled arrival to the hospital.  Clear liquids include  --Water or Apple juice without pulp  --Clear carbohydrate beverage such as ClearFast or Gatorade  --Black Coffee or Clear Tea (No milk, no creamers, do not add anything to the coffee or Tea   ____Ensure clear carbohydrate drink on the way to the hospital for bariatric patients  ____Ensure clear carbohydrate drink 3 hours before surgery for Dr Dwyane Luo patients if physician instructed.    __x__ 2. No Alcohol for 24 hours before or after surgery.   __x__3. No Smoking or e-cigarettes for 24 prior to surgery.  Do not use any chewable tobacco products for at least 6 hour prior to surgery   ____  4. Bring all medications with you on the day of surgery if instructed.    __x__ 5. Notify your doctor if there is any change in your medical condition     (cold, fever, infections).    x___6. On the morning of surgery brush your teeth with toothpaste and water.  You may rinse your mouth with mouth wash if you wish.  Do not swallow any toothpaste or mouthwash.   Do not wear jewelry, make-up, hairpins, clips or nail polish.  Do not wear lotions, powders, or perfumes.  You may wear deodorant.  Do not shave 48 hours prior to surgery. Men may shave face and neck.  Do not bring valuables to the hospital.    [facility] is not responsible for any belongings or valuables.               Contacts, dentures or bridgework may not be worn into surgery.  Leave your suitcase in the car. After surgery it may be brought to your room.  For patients admitted to the hospital, discharge time is determined by your treatment team.  _  Patients discharged the day of surgery will not be allowed to drive home.  You will need someone to drive you home and stay with you the night of your procedure.    Please read over the following fact sheets that you were given:   [facility] Preparing for Surgery   _x___ TAKE THE FOLLOWING MEDICATION THE MORNING OF SURGERY WITH A SMALL SIP OF WATER. These include:  1. [place]  2. TAKE AN EXTRA NEXIUM THE NIGHT BEFORE YOUR SURGERY  3.  4.  5.  6.  ____Fleets enema or Magnesium Citrate as directed.   _x___ Use CHG Soap or sage wipes as directed on instruction sheet   ____ Use inhalers on the day of surgery and bring to hospital day of surgery  ____ Stop Metformin and Janumet 2 days prior to surgery.    ____ Take  1/2 of usual insulin dose the night before surgery and none on the morning surgery.   ____ Follow recommendations from Cardiologist, Pulmonologist or PCP regarding stopping Aspirin, Coumadin, Plavix ,Eliquis, Effient, or Pradaxa, and Pletal.  ____Stop Anti-inflammatories such as Advil, Aleve, Ibuprofen, Motrin, Naproxen, Naprosyn, Goodies powders or aspirin products. OK to take Tylenol    ____ Stop supplements until after surgery.     ____ Bring C-Pap to the hospital.

## 2018-01-16 ENCOUNTER — Encounter
Admission: RE | Admit: 2018-01-16 | Discharge: 2018-01-16 | Disposition: A | Discharge status: Home / Self Care | Payer: Medicare Other | Source: Ambulatory Visit | Attending: General Surgery | Admitting: General Surgery

## 2018-01-16 DIAGNOSIS — I10 Essential (primary) hypertension: Secondary | ICD-10-CM | POA: Insufficient documentation

## 2018-01-16 DIAGNOSIS — N6092 Unspecified benign mammary dysplasia of left breast: Secondary | ICD-10-CM | POA: Diagnosis not present

## 2018-01-16 DIAGNOSIS — N6323 Unspecified lump in the left breast, lower outer quadrant: Secondary | ICD-10-CM | POA: Diagnosis present

## 2018-01-16 DIAGNOSIS — Z01818 Encounter for other preprocedural examination: Secondary | ICD-10-CM | POA: Insufficient documentation

## 2018-01-16 LAB — BASIC METABOLIC PANEL
ANION GAP: 9 (ref 5–15)
BUN: 13 mg/dL (ref 8–23)
CHLORIDE: 101 mmol/L (ref 98–111)
CO2: 29 mmol/L (ref 22–32)
Calcium: 8.9 mg/dL (ref 8.9–10.3)
Creatinine, Ser: 0.89 mg/dL (ref 0.44–1.00)
GFR calc non Af Amer: >60 mL/min (ref >60)
GLUCOSE: 92 mg/dL (ref 70–99)
POTASSIUM: 4.1 mmol/L (ref 3.5–5.1)
Sodium: 139 mmol/L (ref 135–145)

## 2018-01-20 ENCOUNTER — Encounter
Admission: RE | Disposition: A | Discharge status: Home / Self Care | Payer: Self-pay | Source: Ambulatory Visit | Attending: General Surgery

## 2018-01-20 ENCOUNTER — Other Ambulatory Visit: Payer: Self-pay

## 2018-01-20 ENCOUNTER — Ambulatory Visit: Payer: Medicare Other | Admitting: Anesthesiology

## 2018-01-20 ENCOUNTER — Ambulatory Visit
Admission: RE | Admit: 2018-01-20 | Discharge: 2018-01-20 | Disposition: A | Discharge status: Home / Self Care | Payer: Medicare Other | Source: Ambulatory Visit | Attending: General Surgery | Admitting: General Surgery

## 2018-01-20 ENCOUNTER — Encounter: Payer: Self-pay | Admitting: Emergency Medicine

## 2018-01-20 ENCOUNTER — Ambulatory Visit: Payer: Medicare Other

## 2018-01-20 DIAGNOSIS — N6022 Fibroadenosis of left breast: Secondary | ICD-10-CM | POA: Insufficient documentation

## 2018-01-20 DIAGNOSIS — M069 Rheumatoid arthritis, unspecified: Secondary | ICD-10-CM | POA: Diagnosis not present

## 2018-01-20 DIAGNOSIS — K219 Gastro-esophageal reflux disease without esophagitis: Secondary | ICD-10-CM | POA: Insufficient documentation

## 2018-01-20 DIAGNOSIS — N6099 Unspecified benign mammary dysplasia of unspecified breast: Secondary | ICD-10-CM

## 2018-01-20 DIAGNOSIS — I10 Essential (primary) hypertension: Secondary | ICD-10-CM | POA: Diagnosis not present

## 2018-01-20 DIAGNOSIS — N6042 Mammary duct ectasia of left breast: Secondary | ICD-10-CM | POA: Insufficient documentation

## 2018-01-20 DIAGNOSIS — N62 Hypertrophy of breast: Secondary | ICD-10-CM | POA: Insufficient documentation

## 2018-01-20 DIAGNOSIS — N6320 Unspecified lump in the left breast, unspecified quadrant: Secondary | ICD-10-CM

## 2018-01-20 DIAGNOSIS — D493 Neoplasm of unspecified behavior of breast: Secondary | ICD-10-CM

## 2018-01-20 DIAGNOSIS — E785 Hyperlipidemia, unspecified: Secondary | ICD-10-CM | POA: Diagnosis not present

## 2018-01-20 DIAGNOSIS — N6092 Unspecified benign mammary dysplasia of left breast: Secondary | ICD-10-CM | POA: Diagnosis present

## 2018-01-20 HISTORY — DX: Unspecified benign mammary dysplasia of unspecified breast: N60.99

## 2018-01-20 HISTORY — PX: BREAST BIOPSY: SHX20

## 2018-01-20 HISTORY — PX: BREAST LUMPECTOMY: SHX2

## 2018-01-20 SURGERY — BREAST LUMPECTOMY
Anesthesia: General | Laterality: Left

## 2018-01-20 MED ORDER — ACETAMINOPHEN 10 MG/ML IV SOLN
INTRAVENOUS | Status: AC
  Filled 2018-01-20: qty 100

## 2018-01-20 MED ORDER — FENTANYL CITRATE (PF) 100 MCG/2ML IJ SOLN
INTRAMUSCULAR | Status: DC | PRN
  Administered 2018-01-20 (×2): 25 ug via INTRAVENOUS
  Administered 2018-01-20: 50 ug via INTRAVENOUS

## 2018-01-20 MED ORDER — ONDANSETRON HCL 4 MG/2ML IJ SOLN
INTRAMUSCULAR | Status: DC | PRN
  Administered 2018-01-20: 4 mg via INTRAVENOUS

## 2018-01-20 MED ORDER — MIDAZOLAM HCL 2 MG/2ML IJ SOLN
INTRAMUSCULAR | Status: DC | PRN
  Administered 2018-01-20: 2 mg via INTRAVENOUS

## 2018-01-20 MED ORDER — ONDANSETRON HCL 4 MG/2ML IJ SOLN
INTRAMUSCULAR | Status: AC
  Filled 2018-01-20: qty 2

## 2018-01-20 MED ORDER — PHENYLEPHRINE HCL 10 MG/ML IJ SOLN
INTRAMUSCULAR | Status: DC | PRN
  Administered 2018-01-20: 100 ug via INTRAVENOUS

## 2018-01-20 MED ORDER — ACETAMINOPHEN 10 MG/ML IV SOLN
INTRAVENOUS | Status: DC | PRN
  Administered 2018-01-20: 1000 mg via INTRAVENOUS

## 2018-01-20 MED ORDER — BUPIVACAINE-EPINEPHRINE (PF) 0.5% -1:200000 IJ SOLN
INTRAMUSCULAR | Status: DC | PRN
  Administered 2018-01-20: 30 mL

## 2018-01-20 MED ORDER — LACTATED RINGERS IV SOLN
INTRAVENOUS | Status: DC
  Administered 2018-01-20 (×2): via INTRAVENOUS

## 2018-01-20 MED ORDER — PROPOFOL 10 MG/ML IV BOLUS
INTRAVENOUS | Status: DC | PRN
  Administered 2018-01-20: 160 mg via INTRAVENOUS
  Administered 2018-01-20: 40 mg via INTRAVENOUS

## 2018-01-20 MED ORDER — BUPIVACAINE-EPINEPHRINE (PF) 0.5% -1:200000 IJ SOLN
INTRAMUSCULAR | Status: AC
  Filled 2018-01-20: qty 30

## 2018-01-20 MED ORDER — PROPOFOL 10 MG/ML IV BOLUS
INTRAVENOUS | Status: AC
  Filled 2018-01-20: qty 20

## 2018-01-20 MED ORDER — FENTANYL CITRATE (PF) 100 MCG/2ML IJ SOLN
INTRAMUSCULAR | Status: AC
  Filled 2018-01-20: qty 2

## 2018-01-20 MED ORDER — LIDOCAINE HCL (PF) 2 % IJ SOLN
INTRAMUSCULAR | Status: AC
  Filled 2018-01-20: qty 10

## 2018-01-20 MED ORDER — EPHEDRINE SULFATE 50 MG/ML IJ SOLN
INTRAMUSCULAR | Status: DC | PRN
  Administered 2018-01-20 (×2): 10 mg via INTRAVENOUS

## 2018-01-20 MED ORDER — LIDOCAINE HCL (CARDIAC) PF 100 MG/5ML IV SOSY
PREFILLED_SYRINGE | INTRAVENOUS | Status: DC | PRN
  Administered 2018-01-20: 100 mg via INTRAVENOUS

## 2018-01-20 MED ORDER — HYDROCODONE-ACETAMINOPHEN 5-325 MG PO TABS: 1.0000 | ORAL_TABLET | ORAL | 0 refills | Status: DC | PRN

## 2018-01-20 MED ORDER — MIDAZOLAM HCL 2 MG/2ML IJ SOLN
INTRAMUSCULAR | Status: AC
  Filled 2018-01-20: qty 2

## 2018-01-20 MED ORDER — DEXAMETHASONE SODIUM PHOSPHATE 10 MG/ML IJ SOLN
INTRAMUSCULAR | Status: DC | PRN
  Administered 2018-01-20: 10 mg via INTRAVENOUS

## 2018-01-20 MED ORDER — DEXAMETHASONE SODIUM PHOSPHATE 10 MG/ML IJ SOLN
INTRAMUSCULAR | Status: AC
  Filled 2018-01-20: qty 1

## 2018-01-20 SURGICAL SUPPLY — 56 items
BINDER BREAST LRG (GAUZE/BANDAGES/DRESSINGS) IMPLANT
BINDER BREAST MEDIUM (GAUZE/BANDAGES/DRESSINGS) IMPLANT
BINDER BREAST XLRG (GAUZE/BANDAGES/DRESSINGS) IMPLANT
BINDER BREAST XXLRG (GAUZE/BANDAGES/DRESSINGS) ×2 IMPLANT
BLADE PHOTON ILLUMINATED (MISCELLANEOUS) IMPLANT
BLADE SURG 15 STRL SS SAFETY (BLADE) ×2 IMPLANT
BULB RESERV EVAC DRAIN JP 100C (MISCELLANEOUS) IMPLANT
CANISTER SUCT 1200ML W/VALVE (MISCELLANEOUS) ×2 IMPLANT
CHLORAPREP W/TINT 26ML (MISCELLANEOUS) ×2 IMPLANT
CNTNR SPEC 2.5X3XGRAD LEK (MISCELLANEOUS)
CONT SPEC 4OZ STER OR WHT (MISCELLANEOUS)
CONTAINER SPEC 2.5X3XGRAD LEK (MISCELLANEOUS) IMPLANT
COVER PROBE FLX POLY STRL (MISCELLANEOUS) ×2 IMPLANT
COVER WAND RF STERILE (DRAPES) ×2 IMPLANT
DEVICE DUBIN SPECIMEN MAMMOGRA (MISCELLANEOUS) ×2 IMPLANT
DRAIN CHANNEL JP 15F RND 16 (MISCELLANEOUS) IMPLANT
DRAPE LAPAROTOMY 100X77 ABD (DRAPES) ×2 IMPLANT
DRAPE LAPAROTOMY TRNSV 106X77 (MISCELLANEOUS) ×2 IMPLANT
DRSG GAUZE FLUFF 36X18 (GAUZE/BANDAGES/DRESSINGS) ×2 IMPLANT
DRSG TELFA 3X8 NADH (GAUZE/BANDAGES/DRESSINGS) ×2 IMPLANT
DRSG TELFA 4X3 1S NADH ST (GAUZE/BANDAGES/DRESSINGS) ×2 IMPLANT
ELECT CAUTERY BLADE TIP 2.5 (TIP) ×2
ELECT REM PT RETURN 9FT ADLT (ELECTROSURGICAL) ×2
ELECTRODE CAUTERY BLDE TIP 2.5 (TIP) ×1 IMPLANT
ELECTRODE REM PT RTRN 9FT ADLT (ELECTROSURGICAL) ×1 IMPLANT
GLOVE BIO SURGEON STRL SZ7.5 (GLOVE) ×4 IMPLANT
GLOVE INDICATOR 8.0 STRL GRN (GLOVE) ×4 IMPLANT
GOWN STRL REUS W/ TWL LRG LVL3 (GOWN DISPOSABLE) ×2 IMPLANT
GOWN STRL REUS W/TWL LRG LVL3 (GOWN DISPOSABLE) ×2
KIT TURNOVER KIT A (KITS) ×2 IMPLANT
LABEL OR SOLS (LABEL) ×2 IMPLANT
MARGIN MAP 10MM (MISCELLANEOUS) ×2 IMPLANT
MARKER CLIP DUAL 17X12 TITANI (CLIP) ×2 IMPLANT
NEEDLE HYPO 22GX1.5 SAFETY (NEEDLE) ×2 IMPLANT
NEEDLE HYPO 25X1 1.5 SAFETY (NEEDLE) ×2 IMPLANT
PACK BASIN MINOR ARMC (MISCELLANEOUS) ×2 IMPLANT
RETRACTOR RING XSMALL (MISCELLANEOUS) IMPLANT
RTRCTR WOUND ALEXIS 13CM XS SH (MISCELLANEOUS)
SHEARS FOC LG CVD HARMONIC 17C (MISCELLANEOUS) IMPLANT
SHEARS HARMONIC 9CM CVD (BLADE) IMPLANT
STRIP CLOSURE SKIN 1/2X4 (GAUZE/BANDAGES/DRESSINGS) ×2 IMPLANT
SUT ETHILON 3-0 FS-10 30 BLK (SUTURE) ×2
SUT SILK 2 0 (SUTURE)
SUT SILK 2-0 18XBRD TIE 12 (SUTURE) IMPLANT
SUT VIC AB 2-0 CT1 27 (SUTURE) ×1
SUT VIC AB 2-0 CT1 TAPERPNT 27 (SUTURE) ×1 IMPLANT
SUT VIC AB 4-0 FS2 27 (SUTURE) ×2 IMPLANT
SUT VICRYL+ 3-0 144IN (SUTURE) ×2 IMPLANT
SUTURE EHLN 3-0 FS-10 30 BLK (SUTURE) ×1 IMPLANT
SWABSTK COMLB BENZOIN TINCTURE (MISCELLANEOUS) ×2 IMPLANT
SYR 10ML LL (SYRINGE) ×2 IMPLANT
SYR BULB IRRIG 60ML STRL (SYRINGE) ×2 IMPLANT
SYR CONTROL 10ML (SYRINGE) IMPLANT
SYS BIOPSY MAX CORE 14GX10 (NEEDLE) ×2 IMPLANT
TAPE TRANSPORE STRL 2 31045 (GAUZE/BANDAGES/DRESSINGS) ×2 IMPLANT
WATER STERILE IRR 1000ML POUR (IV SOLUTION) ×2 IMPLANT

## 2018-01-20 NOTE — Anesthesia Preprocedure Evaluation (Signed)
Anesthesia Evaluation  Patient identified by MRN, date of birth, ID band Patient awake    Reviewed: Allergy & Precautions, H&P , NPO status , Patient's Chart, lab work & pertinent test results, reviewed documented beta blocker date and time   History of Anesthesia Complications Negative for: history of anesthetic complications  Airway Mallampati: III  TM Distance: >3 FB Neck ROM: full    Dental  (+) Caps, Dental Advidsory Given, Teeth Intact   Pulmonary neg pulmonary ROS,           Cardiovascular Exercise Tolerance: Good hypertension, (-) angina(-) CAD, (-) Past MI, (-) Cardiac Stents and (-) CABG (-) dysrhythmias (-) Valvular Problems/Murmurs     Neuro/Psych negative neurological ROS  negative psych ROS   GI/Hepatic Neg liver ROS, hiatal hernia, GERD  ,  Endo/Other  negative endocrine ROS  Renal/GU negative Renal ROS  negative genitourinary   Musculoskeletal   Abdominal   Peds  Hematology negative hematology ROS (+)   Anesthesia Other Findings Past Medical History: No date: Allergic rhinitis No date: Colon polyps No date: Fibrocystic breast disease No date: Hyperlipemia No date: Hypertension No date: Postmenopausal bleeding No date: Rheumatoid arthritis (HCC) No date: Vitamin D deficiency   Reproductive/Obstetrics negative OB ROS                             Anesthesia Physical  Anesthesia Plan  ASA: II  Anesthesia Plan: General   Post-op Pain Management:    Induction:   PONV Risk Score and Plan: 3 and Ondansetron, Dexamethasone, Midazolam and Treatment may vary due to age or medical condition  Airway Management Planned: LMA  Additional Equipment:   Intra-op Plan:   Post-operative Plan: Extubation in OR  Informed Consent: I have reviewed the patients History and Physical, chart, labs and discussed the procedure including the risks, benefits and alternatives for the  proposed anesthesia with the patient or authorized representative who has indicated his/her understanding and acceptance.   Dental Advisory Given  Plan Discussed with: Anesthesiologist, CRNA and Surgeon  Anesthesia Plan Comments:         Anesthesia Quick Evaluation

## 2018-01-20 NOTE — Op Note (Signed)
Preoperative diagnosis: Atypical lobular hyperplasia of the upper inner quadrant of the left breast.  Sonographic abnormality at the 5 o'clock position.  Postoperative diagnosis: Same.  Operative procedure: Ultrasound-guided excision of the left upper inner quadrant area of ALH; ultrasound-guided core biopsy of the focal ultrasound distortion in the 5 o'clock position, 6 cm from nipple, placement of ribbon clip.  Operating Surgeon: Hervey Ard, MD.  Anesthesia: General by LMA, Marcaine 0.5% with 1 to 200,000 units of epinephrine, 30 cc.  Estimated blood loss: 5 cc.  Clinical note: This 67 year old woman recently underwent stereotactic biopsy of the upper inner and lower outer quadrants of the left breast.  An area of ALH with cystic papillary metaplasia and fat necrosis was identified.  She is felt to be a candidate for excision.  During preoperative ultrasound there was an area at the 5 o'clock position that was a hypoechoic area without mammographic correlate.  It was felt appropriate to do a core biopsy of this area at the same setting.  Operative note: With the patient under adequate general anesthesia the left breast chest and axilla was cleansed with ChloraPrep and draped.  Ultrasound was used to identify the previous biopsy cavity in the 1030 o'clock position of the left breast approximately 7 cm from the nipple.  Local anesthesia was infiltrated and a gently curving incision from the 10-11 30 o'clock position was made and carried down through skin subtendinous tissue.  Hemostasis was with electrocautery.  A 4 x 4 x 5 cm block of tissue was excised, orientated and sent for specimen radiograph.  The area of distortion and clip was in the center of the specimen.  While the specimen was being processed attention was turned to the lower outer quadrant 5 o'clock position.  The area of hypoechoic changes at the 5 o'clock position, 6 cm from the nipple and about 1-1.5 cm of depth was identified.   A lateral approach was utilized and a stab incision made at which time  6 core samples were obtained with a 14-gauge spring-loaded biopsy device.  The appearance at that tissue was that of adipose tissue.  A ribbon biopsy clip was placed.  The upper inner quadrant site was closed with 2 layers of 2-0 Vicryl figure-of-eight sutures.  The skin was closed with a running 4-0 Vicryl subcuticular suture.  Benzoin and Steri-Strips followed by Telfa pad, fluff gauze and a compressive wrap were applied.  Patient tolerated the procedure well and was taken to recovery room in stable condition.

## 2018-01-20 NOTE — Transfer of Care (Signed)
Immediate Anesthesia Transfer of Care Note  Patient: Annette Bruce  Procedure(s) Performed: BREAST WIDE EXCISION (Left ) BREAST BIOPSY (Left )  Patient Location: PACU  Anesthesia Type:General  Level of Consciousness: awake, alert  and oriented  Airway & Oxygen Therapy: Patient connected to face mask oxygen  Post-op Assessment: Post -op Vital signs reviewed and stable  Post vital signs: stable  Last Vitals:  Vitals Value Taken Time  BP 152/82 01/20/2018  2:57 PM  Temp 36.3 C 01/20/2018  2:57 PM  Pulse 98 01/20/2018  2:58 PM  Resp 13 01/20/2018  2:58 PM  SpO2 100 % 01/20/2018  2:58 PM  Vitals shown include unvalidated device data.  Last Pain:  Vitals:   01/20/18 1239  TempSrc: Tympanic  PainSc: 0-No pain         Complications: No apparent anesthesia complications

## 2018-01-20 NOTE — Anesthesia Post-op Follow-up Note (Signed)
Anesthesia QCDR form completed.        

## 2018-01-20 NOTE — H&P (Signed)
No change in clinical exam or history.  For left breast biopsy and core biopsy.

## 2018-01-20 NOTE — Discharge Instructions (Signed)
AMBULATORY SURGERY  °DISCHARGE INSTRUCTIONS ° ° °1) The drugs that you were given will stay in your system until tomorrow so for the next 24 hours you should not: ° °A) Drive an automobile °B) Make any legal decisions °C) Drink any alcoholic beverage ° ° °2) You may resume regular meals tomorrow.  Today it is better to start with liquids and gradually work up to solid foods. ° °You may eat anything you prefer, but it is better to start with liquids, then soup and crackers, and gradually work up to solid foods. ° ° °3) Please notify your doctor immediately if you have any unusual bleeding, trouble breathing, redness and pain at the surgery site, drainage, fever, or pain not relieved by medication. ° ° ° °4) Additional Instructions: ° ° ° ° ° ° ° °Please contact your physician with any problems or Same Day Surgery at [phone], Monday through Friday 6 am to 4 pm, or [place] at [place] [facility] number at [phone]. °

## 2018-01-20 NOTE — Anesthesia Procedure Notes (Signed)
Procedure Name: LMA Insertion Date/Time: 01/20/2018 1:52 PM Performed by: Aline Brochure, CRNA Pre-anesthesia Checklist: Patient identified, Emergency Drugs available, Suction available and Patient being monitored Patient Re-evaluated:Patient Re-evaluated prior to induction Oxygen Delivery Method: Circle system utilized Preoxygenation: Pre-oxygenation with 100% oxygen Induction Type: IV induction Ventilation: Mask ventilation without difficulty LMA: LMA inserted LMA Size: 4.0 Number of attempts: 1 Placement Confirmation: positive ETCO2 and breath sounds checked- equal and bilateral Tube secured with: Tape Dental Injury: Teeth and Oropharynx as per pre-operative assessment

## 2018-01-21 ENCOUNTER — Encounter: Payer: Self-pay | Admitting: General Surgery

## 2018-01-21 NOTE — Anesthesia Postprocedure Evaluation (Addendum)
Anesthesia Post Note  Patient: Annette Bruce  Procedure(s) Performed: BREAST WIDE EXCISION (Left ) BREAST BIOPSY (Left )  Patient location during evaluation: PACU Anesthesia Type: General Level of consciousness: awake and alert Pain management: pain level controlled Vital Signs Assessment: post-procedure vital signs reviewed and stable Respiratory status: spontaneous breathing, nonlabored ventilation, respiratory function stable and patient connected to nasal cannula oxygen Cardiovascular status: blood pressure returned to baseline and stable Postop Assessment: no apparent nausea or vomiting Anesthetic complications: no     Last Vitals:  Vitals:   01/20/18 1536 01/20/18 1609  BP: (!) 149/73 135/72  Pulse: 75 74  Resp: 16 16  Temp: 36.4 C   SpO2: 96% 96%    Last Pain:  Vitals:   01/21/18 0831  TempSrc:   PainSc: 0-No pain                 Martha Clan

## 2018-01-21 NOTE — Addendum Note (Signed)
Addendum  created 01/21/18 1520 by Martha Clan, MD   Sign clinical note

## 2018-01-22 ENCOUNTER — Telehealth: Payer: Self-pay | Admitting: General Surgery

## 2018-01-22 LAB — SURGICAL PATHOLOGY

## 2018-01-22 NOTE — Telephone Encounter (Signed)
Notified pathology was fine. Reports doing well. F/U next week.

## 2018-01-28 ENCOUNTER — Encounter: Payer: Self-pay | Admitting: General Surgery

## 2018-01-28 ENCOUNTER — Ambulatory Visit (INDEPENDENT_AMBULATORY_CARE_PROVIDER_SITE_OTHER): Payer: Medicare Other | Admitting: General Surgery

## 2018-01-28 ENCOUNTER — Other Ambulatory Visit: Payer: Self-pay

## 2018-01-28 VITALS — BP 138/108 | HR 125 | Temp 97.7°F | Resp 16 | Ht 69.0 in | Wt 275.0 lb

## 2018-01-28 DIAGNOSIS — N6092 Unspecified benign mammary dysplasia of left breast: Secondary | ICD-10-CM

## 2018-01-28 NOTE — Patient Instructions (Addendum)
Patient will return to the office in 1 month. Patient has been informed to call the office with any questions or concerns. The medication that Dr. Bary Castilla has provided information on tamoxifen. Please call the office with a decision.

## 2018-01-28 NOTE — Progress Notes (Signed)
Patient ID: Annette Bruce, female   DOB: July 10, 1950, 67 y.o.   MRN: [id number]  Chief Complaint  Patient presents with  . Routine Post Op    post op BREAST WIDE EXCISION dr Evalyne Cortopassi 01/20/18    HPI Annette Bruce is a 67 y.o. female here today for post op wide left breast excision 01/20/18. Patient states she is having some soreness and minimal pain.  HPI  Past Medical History:  Diagnosis Date  . Allergic rhinitis   . Colon polyps   . Fibrocystic breast disease   . GERD (gastroesophageal reflux disease)   . History of hiatal hernia    SMALL PER PT  . Hyperlipemia   . Hypertension   . Postmenopausal bleeding   . Rheumatoid arthritis ([place])    Dr Jefm Bryant  . Vitamin D deficiency     Past Surgical History:  Procedure Laterality Date  . APPENDECTOMY    . BREAST BIOPSY Left 01/02/2018   x marker, LOQ, CYSTIC PAPILLARY APOCRINE METAPLASIA.   Marland Kitchen BREAST BIOPSY Left 01/02/2018   coil marker, UIQ, ATYPICAL LOBULAR HYPERPLASIA, CYSTIC PAPILLARY APOCRINE METAPLASIA  . BREAST BIOPSY Left 01/20/2018   Procedure: BREAST BIOPSY;  Surgeon: Robert Bellow, MD;  Location: [facility] ORS;  Service: General;  Laterality: Left;  . BREAST CYST ASPIRATION Left 1970's   negative  . BREAST LUMPECTOMY Left 01/20/2018   Procedure: BREAST WIDE EXCISION;  Surgeon: Robert Bellow, MD;  Location: [facility] ORS;  Service: General;  Laterality: Left;  . COLONOSCOPY WITH PROPOFOL N/A 12/16/2015   Procedure: COLONOSCOPY WITH PROPOFOL;  Surgeon: Lollie Sails, MD;  Location: [facility] ENDOSCOPY;  Service: Endoscopy;  Laterality: N/A;  . ESOPHAGOGASTRODUODENOSCOPY (EGD) WITH PROPOFOL N/A 12/16/2015   Procedure: ESOPHAGOGASTRODUODENOSCOPY (EGD) WITH PROPOFOL;  Surgeon: Lollie Sails, MD;  Location: [facility] ENDOSCOPY;  Service: Endoscopy;  Laterality: N/A;  . TONSILLECTOMY      Family History  Problem Relation Age of Onset  . Colon cancer Mother 58  . Breast cancer Sister 59  . Breast cancer Maternal Aunt 70     Social History Social History   Tobacco Use  . Smoking status: Never Smoker  . Smokeless tobacco: Never Used  Substance Use Topics  . Alcohol use: No  . Drug use: No    Allergies  Allergen Reactions  . Doxycycline Hives    Current Outpatient Medications  Medication Sig Dispense Refill  . Adalimumab (HUMIRA PEN) 40 MG/0.8ML PNKT Inject into the skin every 14 (fourteen) days.    . Calcium 500 MG CHEW Chew 2 tablets by mouth daily.    . diclofenac (VOLTAREN) 75 MG EC tablet Take 75 mg by mouth daily as needed for moderate pain.     Marland Kitchen esomeprazole (NEXIUM) 40 MG capsule Take 40 mg by mouth every morning.     . fluticasone (FLONASE) 50 MCG/ACT nasal spray Place 2 sprays into both nostrils daily as needed for allergies or rhinitis.    . hydrochlorothiazide (HYDRODIURIL) 25 MG tablet Take 25 mg by mouth daily.    Marland Kitchen HYDROcodone-acetaminophen (NORCO/VICODIN) 5-325 MG tablet Take 1 tablet by mouth every 4 (four) hours as needed for moderate pain. 15 tablet 0  . simvastatin (ZOCOR) 20 MG tablet Take 20 mg by mouth daily at 6 PM.     . triamcinolone ointment (KENALOG) 0.1 % Apply 1 application topically daily as needed (irritation).      No current facility-administered medications for this visit.     Review of Systems Review  of Systems  Constitutional: Negative.   Respiratory: Negative.   Cardiovascular: Negative.     Blood pressure (!) 138/108, pulse (!) 125, temperature 97.7 F (36.5 C), temperature source Temporal, resp. rate 16, height 5\' 9"  (1.753 m), weight 275 lb (124.7 kg), SpO2 98 %.  Physical Exam Physical Exam  Constitutional: She is oriented to person, place, and time. She appears well-developed and well-nourished.  Eyes: Conjunctivae are normal. No scleral icterus.  Pulmonary/Chest: Right breast exhibits no inverted nipple, no mass, no nipple discharge, no skin change and no tenderness. Left breast exhibits tenderness. Left breast exhibits no inverted nipple, no  mass, no nipple discharge and no skin change. Breasts are symmetrical.    Lymphadenopathy:    She has no cervical adenopathy.  Neurological: She is alert and oriented to person, place, and time.  Skin: Skin is warm and dry.    Data Reviewed Surgical Pathology of January 20, 2018:  A. BREAST, LEFT UPPER INNER QUADRANT; ULTRASOUND-GUIDED EXCISION:  - COLUMNAR CELL CHANGE, SCLEROSING ADENOSIS, FIBROADENOMATOID CHANGE,  USUAL DUCTAL HYPERPLASIA, APOCRINE METAPLASIA, DUCT ECTASIA, ATYPICAL  LOBULAR HYPERPLASIA, AND CYST FORMATION.  - BIOPSY SITE CHANGE WITH METALLIC CLIP.  - MARGINS ARE UNREMARKABLE.  - NEGATIVE FOR MALIGNANCY.   B. BREAST, LEFT 5:00; ULTRASOUND-GUIDED BIOPSY:  - PREDOMINANTLY BENIGN ADIPOSE TISSUE WITH FIBROUS STROMA AND RARE  SCATTERED MAMMARY EPITHELIUM WITH INVOLUTIONAL CHANGE.  - FOCAL CHANGES SUGGESTIVE OF FAT NECROSIS.  - NEGATIVE FOR ATYPIA AND MALIGNANCY.  - DEEPERS SECTIONS EXAMINED.   Assessment    No upstaging beyond atypical lobular hyperplasia.  Candidate for chemoprevention based on identification of atypical lobular hyperplasia.    Plan  Patient will return to the office in 1 month. Patient has been informed to call the office with any questions or concerns.  The medication that Dr. Bary Castilla has recommended for her consideration is tamoxifen. Please call the office with a decision on the desire to make use of chemoprevention.  HPI, Physical Exam, Assessment and Plan have been scribed under the direction and in the presence of Hervey Ard, Md.  Eudelia Bunch R. Bobette Mo, CMA  I have completed the exam and reviewed the above documentation for accuracy and completeness.  I agree with the above.  Haematologist has been used and any errors in dictation or transcription are unintentional.  Hervey Ard, M.D., F.A.C.S.  Annette Bruce 01/29/2018, 5:16 PM

## 2018-02-10 ENCOUNTER — Telehealth: Payer: Self-pay

## 2018-02-10 DIAGNOSIS — M069 Rheumatoid arthritis, unspecified: Secondary | ICD-10-CM | POA: Insufficient documentation

## 2018-02-10 NOTE — Telephone Encounter (Signed)
Patient called and she has spoken to her doctors about taking Tamoxifen and she has decided to go ahead and start this medication.  Message to Dr Bary Castilla about this.

## 2018-02-19 MED ORDER — TAMOXIFEN CITRATE 20 MG PO TABS: 20.0000 mg | ORAL_TABLET | Freq: Every day | ORAL | 3 refills | Status: DC

## 2018-02-19 NOTE — Telephone Encounter (Signed)
A prescription for tamoxifen has been sent to the patient's pharmacy.  She should take 1 daily.  Phone follow-up in 1 month with tolerance.  She should take a pediatric, 81 mg aspirin daily as well.  She should promptly report any leg swelling or shortness of breath.  Phone follow-up in 1 month to report her tolerance of the medication.

## 2018-02-20 NOTE — Telephone Encounter (Signed)
Patient notified and will start 81 mg aspirin with her Tamoxifen.

## 2018-02-25 ENCOUNTER — Ambulatory Visit (INDEPENDENT_AMBULATORY_CARE_PROVIDER_SITE_OTHER): Payer: Medicare Other | Admitting: General Surgery

## 2018-02-25 ENCOUNTER — Encounter: Payer: Self-pay | Admitting: General Surgery

## 2018-02-25 ENCOUNTER — Other Ambulatory Visit: Payer: Self-pay

## 2018-02-25 VITALS — BP 135/81 | HR 83 | Temp 97.9°F | Resp 12 | Ht 69.0 in | Wt 277.0 lb

## 2018-02-25 DIAGNOSIS — N6092 Unspecified benign mammary dysplasia of left breast: Secondary | ICD-10-CM

## 2018-02-25 NOTE — Patient Instructions (Signed)
May apply scar fade or mederma cream to incision as desired. Call with status report in one month of taking tamoxifen The patient has been asked to return to the office in September  with a bilateral diagnostic mammogram.

## 2018-02-25 NOTE — Progress Notes (Signed)
Patient ID: Annette Bruce, female   DOB: 25-Jul-1950, 67 y.o.   MRN: [id number]  Chief Complaint  Patient presents with  . Follow-up    HPI Annette Bruce is a 67 y.o. female. Here for left breast follow up. No new breat issues. She picked tamoxifen up from pharmacy yesterday and will start tonight.  HPI  Past Medical History:  Diagnosis Date  . Allergic rhinitis   . Atypical lobular hyperplasia (ALH) of breast 01/20/2018  . Colon polyps   . GERD (gastroesophageal reflux disease)   . History of hiatal hernia    SMALL PER PT  . Hyperlipemia   . Hypertension   . Postmenopausal bleeding   . Rheumatoid arthritis ([place])    Dr Jefm Bryant  . Vitamin D deficiency     Past Surgical History:  Procedure Laterality Date  . APPENDECTOMY    . BREAST BIOPSY Left 01/02/2018   x marker, LOQ, CYSTIC PAPILLARY APOCRINE METAPLASIA.   Marland Kitchen BREAST BIOPSY Left 01/02/2018   coil marker, UIQ, ATYPICAL LOBULAR HYPERPLASIA, CYSTIC PAPILLARY APOCRINE METAPLASIA  . BREAST BIOPSY Left 01/20/2018   Procedure: BREAST BIOPSY;  Surgeon: Robert Bellow, MD;  Location: [facility] ORS;  Service: General;  Laterality: Left;  . BREAST CYST ASPIRATION Left 1970's   negative  . BREAST LUMPECTOMY Left 01/20/2018   Procedure: BREAST WIDE EXCISION;  Surgeon: Robert Bellow, MD;  Location: [facility] ORS;  Service: General;  Laterality: Left;  . COLONOSCOPY WITH PROPOFOL N/A 12/16/2015   Procedure: COLONOSCOPY WITH PROPOFOL;  Surgeon: Lollie Sails, MD;  Location: [facility] ENDOSCOPY;  Service: Endoscopy;  Laterality: N/A;  . ESOPHAGOGASTRODUODENOSCOPY (EGD) WITH PROPOFOL N/A 12/16/2015   Procedure: ESOPHAGOGASTRODUODENOSCOPY (EGD) WITH PROPOFOL;  Surgeon: Lollie Sails, MD;  Location: [facility] ENDOSCOPY;  Service: Endoscopy;  Laterality: N/A;  . TONSILLECTOMY      Family History  Problem Relation Age of Onset  . Colon cancer Mother 74  . Breast cancer Sister 42  . Breast cancer Maternal Aunt 70    Social  History Social History   Tobacco Use  . Smoking status: Never Smoker  . Smokeless tobacco: Never Used  Substance Use Topics  . Alcohol use: No  . Drug use: No    Allergies  Allergen Reactions  . Doxycycline Hives    Current Outpatient Medications  Medication Sig Dispense Refill  . Adalimumab (HUMIRA PEN) 40 MG/0.8ML PNKT Inject into the skin every 14 (fourteen) days.    Marland Kitchen aspirin EC 81 MG tablet Take 81 mg by mouth daily.    . Calcium 500 MG CHEW Chew 2 tablets by mouth daily.    . Cholecalciferol (VITAMIN D3) 125 MCG (5000 UT) TABS Take by mouth daily.    . diclofenac (VOLTAREN) 75 MG EC tablet Take 75 mg by mouth daily as needed for moderate pain.     Marland Kitchen esomeprazole (NEXIUM) 40 MG capsule Take 40 mg by mouth every morning.     . fluticasone (FLONASE) 50 MCG/ACT nasal spray Place 2 sprays into both nostrils daily as needed for allergies or rhinitis.    . hydrochlorothiazide (HYDRODIURIL) 25 MG tablet Take 25 mg by mouth daily.    . Probiotic Product (ALIGN PO) Take by mouth daily.    . simvastatin (ZOCOR) 20 MG tablet Take 20 mg by mouth daily at 6 PM.     . tamoxifen (NOLVADEX) 20 MG tablet Take 1 tablet (20 mg total) by mouth daily. 90 tablet 3  . triamcinolone ointment (KENALOG) 0.1 %  Apply 1 application topically daily as needed (irritation).      No current facility-administered medications for this visit.     Review of Systems Review of Systems  Constitutional: Negative.   Respiratory: Negative.   Cardiovascular: Negative.     Blood pressure 135/81, pulse 83, temperature 97.9 F (36.6 C), temperature source Skin, resp. rate 12, height 5\' 9"  (1.753 m), weight 277 lb (125.6 kg), SpO2 98 %.  Physical Exam Physical Exam  Constitutional: She is oriented to person, place, and time. She appears well-developed and well-nourished.  HENT:  Mouth/Throat: Oropharynx is clear and moist.  Eyes: Conjunctivae are normal. No scleral icterus.  Pulmonary/Chest: Right breast  exhibits no inverted nipple, no mass, no nipple discharge, no skin change and no tenderness. Left breast exhibits no inverted nipple, no mass, no nipple discharge, no skin change and no tenderness.  Left breast incision clean    Lymphadenopathy:    She has no cervical adenopathy.    She has no axillary adenopathy.  Neurological: She is alert and oriented to person, place, and time.  Skin: Skin is warm and dry.  Psychiatric: Her behavior is normal.    Data Reviewed Atypical lobular hyperplasia.  Assessment    Doing well post excisional biopsy.    Plan     May apply scar fade or mederma cream to incision as desired. Call with status report in one month of taking tamoxifen.  The importance of using the medication for a minimum of 1 month to determine how her response will be was reviewed.  Importance of pediatric aspirin discussed. The patient has been asked to return to the office in September  with a bilateral diagnostic mammogram.      HPI, Physical Exam, Assessment and Plan have been scribed under the direction and in the presence of Robert Bellow, MD. Karie Fetch, RN  I have completed the exam and reviewed the above documentation for accuracy and completeness.  I agree with the above.  Haematologist has been used and any errors in dictation or transcription are unintentional.  Hervey Ard, M.D., F.A.C.S.  Forest Gleason Khadim Lundberg 02/25/2018, 1:49 PM

## 2018-08-19 DIAGNOSIS — C50912 Malignant neoplasm of unspecified site of left female breast: Secondary | ICD-10-CM | POA: Insufficient documentation

## 2018-10-21 ENCOUNTER — Encounter: Payer: Self-pay | Admitting: General Surgery

## 2018-11-06 ENCOUNTER — Other Ambulatory Visit: Payer: Self-pay

## 2018-11-06 DIAGNOSIS — N6092 Unspecified benign mammary dysplasia of left breast: Secondary | ICD-10-CM

## 2018-12-23 ENCOUNTER — Ambulatory Visit
Admission: RE | Admit: 2018-12-23 | Discharge: 2018-12-23 | Disposition: A | Discharge status: Home / Self Care | Payer: Medicare Other | Source: Ambulatory Visit | Attending: Surgery | Admitting: Surgery

## 2018-12-23 DIAGNOSIS — Z803 Family history of malignant neoplasm of breast: Secondary | ICD-10-CM | POA: Insufficient documentation

## 2018-12-23 DIAGNOSIS — N6092 Unspecified benign mammary dysplasia of left breast: Secondary | ICD-10-CM | POA: Diagnosis present

## 2018-12-31 ENCOUNTER — Other Ambulatory Visit: Payer: Self-pay

## 2018-12-31 ENCOUNTER — Ambulatory Visit: Payer: Medicare Other | Admitting: Surgery

## 2018-12-31 ENCOUNTER — Encounter: Payer: Self-pay | Admitting: Surgery

## 2018-12-31 VITALS — BP 151/89 | HR 71 | Temp 97.5°F | Resp 14 | Ht 69.0 in | Wt 269.0 lb

## 2018-12-31 DIAGNOSIS — I10 Essential (primary) hypertension: Secondary | ICD-10-CM | POA: Insufficient documentation

## 2018-12-31 DIAGNOSIS — N6092 Unspecified benign mammary dysplasia of left breast: Secondary | ICD-10-CM

## 2018-12-31 DIAGNOSIS — E785 Hyperlipidemia, unspecified: Secondary | ICD-10-CM | POA: Insufficient documentation

## 2018-12-31 DIAGNOSIS — Z86010 Personal history of colonic polyps: Secondary | ICD-10-CM | POA: Insufficient documentation

## 2018-12-31 DIAGNOSIS — N950 Postmenopausal bleeding: Secondary | ICD-10-CM | POA: Insufficient documentation

## 2018-12-31 DIAGNOSIS — N6019 Diffuse cystic mastopathy of unspecified breast: Secondary | ICD-10-CM | POA: Insufficient documentation

## 2018-12-31 DIAGNOSIS — J309 Allergic rhinitis, unspecified: Secondary | ICD-10-CM | POA: Insufficient documentation

## 2018-12-31 DIAGNOSIS — E559 Vitamin D deficiency, unspecified: Secondary | ICD-10-CM | POA: Insufficient documentation

## 2018-12-31 MED ORDER — TAMOXIFEN CITRATE 20 MG PO TABS: 20.0000 mg | ORAL_TABLET | Freq: Every day | ORAL | 12 refills | Status: DC

## 2018-12-31 NOTE — Patient Instructions (Signed)
We will contact you August 2021 to schedule your Mamomogram and breast exam for September 2021.   Please call if you have questions or concerns.

## 2019-01-03 ENCOUNTER — Encounter: Payer: Self-pay | Admitting: Surgery

## 2019-01-03 NOTE — Progress Notes (Signed)
Outpatient Surgical Follow Up  01/03/2019  Annette Bruce is an 68 y.o. female.   Chief Complaint  Patient presents with  . Follow-up    [place] mammogram    HPI: Annette Bruce is a 68 yo female with hx of ALH after excisional biopsy Left breast by Dr. Bary Castilla 2019. She denies any breast masses, DC or pain. Mammo pers. Reviewed showing no evidence of concerning lesions. She is on tamoxifen and had questions regarding chemo prophilaxis. I had an Extensive d/w the pt regarding tamoxifen , risks, benefits possible side effects and current evidence. She does have mother and sister hx breast CA and sister died at age 37. Her BMI 49, no CAD,DVT or abnormal uterine bleeding   Past Medical History:  Diagnosis Date  . Allergic rhinitis   . Atypical lobular hyperplasia (ALH) of breast 01/20/2018  . Colon polyps   . GERD (gastroesophageal reflux disease)   . History of hiatal hernia    SMALL PER PT  . Hyperlipemia   . Hypertension   . Postmenopausal bleeding   . Rheumatoid arthritis ([place])    Dr Jefm Bryant  . Vitamin D deficiency     Past Surgical History:  Procedure Laterality Date  . APPENDECTOMY    . BREAST BIOPSY Left 01/02/2018   x marker, LOQ, CYSTIC PAPILLARY APOCRINE METAPLASIA.   Marland Kitchen BREAST BIOPSY Left 01/02/2018   coil marker, UIQ, ATYPICAL LOBULAR HYPERPLASIA, CYSTIC PAPILLARY APOCRINE METAPLASIA  . BREAST BIOPSY Left 01/20/2018   Procedure: BREAST BIOPSY;  Surgeon: Robert Bellow, MD;  Location: [facility] ORS;  Service: General;  Laterality: Left;  . BREAST CYST ASPIRATION Left 1970's   negative  . BREAST LUMPECTOMY Left 01/20/2018   Procedure: BREAST WIDE EXCISION;  Surgeon: Robert Bellow, MD;  Location: [facility] ORS;  Service: General;  Laterality: Left;  . COLONOSCOPY WITH PROPOFOL N/A 12/16/2015   Procedure: COLONOSCOPY WITH PROPOFOL;  Surgeon: Lollie Sails, MD;  Location: [facility] ENDOSCOPY;  Service: Endoscopy;  Laterality: N/A;  . ESOPHAGOGASTRODUODENOSCOPY (EGD) WITH PROPOFOL  N/A 12/16/2015   Procedure: ESOPHAGOGASTRODUODENOSCOPY (EGD) WITH PROPOFOL;  Surgeon: Lollie Sails, MD;  Location: [facility] ENDOSCOPY;  Service: Endoscopy;  Laterality: N/A;  . TONSILLECTOMY      Family History  Problem Relation Age of Onset  . Colon cancer Mother 43  . Breast cancer Sister 59  . Breast cancer Maternal Aunt 70    Social History:  reports that she has never smoked. She has never used smokeless tobacco. She reports that she does not drink alcohol or use drugs.  Allergies:  Allergies  Allergen Reactions  . Doxycycline Hives    Medications reviewed.    ROS Full ROS performed and is otherwise negative other than what is stated in HPI   BP (!) 151/89   Pulse 71   Temp (!) 97.5 F (36.4 C) (Temporal)   Resp 14   Ht 5\' 9"  (1.753 m)   Wt 269 lb (122 kg)   SpO2 97%   BMI 39.72 kg/m   Physical Exam Eyes:     General: No scleral icterus.       Right eye: No discharge.        Left eye: No discharge.     Extraocular Movements: Extraocular movements intact.  Neck:     Musculoskeletal: Normal range of motion and neck supple. No neck rigidity or muscular tenderness.  Cardiovascular:     Rate and Rhythm: Normal rate and regular rhythm.     Pulses: Normal pulses.  Pulmonary:     Effort: Pulmonary effort is normal. No respiratory distress.     Breath sounds: No stridor. No wheezing.     Comments: BREAST: NO masses, no DC. nml nipple and skin. No axillary LAD. Previous lumpectomy scar Abdominal:     General: Abdomen is flat. There is no distension.     Palpations: There is no mass.     Tenderness: There is no abdominal tenderness.     Hernia: No hernia is present.  Lymphadenopathy:     Cervical: No cervical adenopathy.  Skin:    Capillary Refill: Capillary refill takes less than 2 seconds.  Neurological:     General: No focal deficit present.     Mental Status: She is oriented to person, place, and time.  Psychiatric:        Mood and Affect: Mood normal.         Behavior: Behavior normal.        Thought Content: Thought content normal.        Judgment: Judgment normal.      Assessment/Plan:  HX ALH and fam hx breast CA. She wishes to continue tamoxifen at least for another year. She will continue yearly exams and mammogram.  Greater than 50% of the 40 minutes  visit was spent in counseling/coordination of care   Caroleen Hamman, MD [place] Surgeon

## 2019-04-22 ENCOUNTER — Ambulatory Visit: Payer: Medicare PPO | Attending: Internal Medicine

## 2019-04-22 DIAGNOSIS — Z23 Encounter for immunization: Secondary | ICD-10-CM

## 2019-05-11 ENCOUNTER — Ambulatory Visit: Payer: Medicare PPO | Attending: Internal Medicine

## 2019-05-11 DIAGNOSIS — Z23 Encounter for immunization: Secondary | ICD-10-CM | POA: Insufficient documentation

## 2019-05-11 NOTE — Progress Notes (Signed)
   Covid-19 Vaccination Clinic  Name:  Annette Bruce    MRN: [id number] DOB: 05/14/1950  05/11/2019  [name] was observed post Covid-19 immunization for 15 minutes without incidence. She was provided with Vaccine Information Sheet and instruction to access the V-Safe system.   [name] was instructed to call 911 with any severe reactions post vaccine: Marland Kitchen Difficulty breathing  . Swelling of your face and throat  . A fast heartbeat  . A bad rash all over your body  . Dizziness and weakness    Immunizations Administered    Name Date Dose VIS Date Route   Pfizer COVID-19 Vaccine 05/11/2019  2:11 PM 0.3 mL 03/13/2019 Intramuscular   Manufacturer: [place]   Lot: [id number]   [place]: [id number]

## 2019-10-26 ENCOUNTER — Other Ambulatory Visit: Payer: Self-pay | Admitting: Student

## 2019-10-26 DIAGNOSIS — R1319 Other dysphagia: Secondary | ICD-10-CM

## 2019-10-26 DIAGNOSIS — K2100 Gastro-esophageal reflux disease with esophagitis, without bleeding: Secondary | ICD-10-CM

## 2019-10-26 DIAGNOSIS — K2970 Gastritis, unspecified, without bleeding: Secondary | ICD-10-CM

## 2019-10-26 DIAGNOSIS — K225 Diverticulum of esophagus, acquired: Secondary | ICD-10-CM

## 2019-10-26 DIAGNOSIS — K449 Diaphragmatic hernia without obstruction or gangrene: Secondary | ICD-10-CM

## 2019-10-28 ENCOUNTER — Other Ambulatory Visit: Payer: Self-pay

## 2019-10-28 DIAGNOSIS — Z1231 Encounter for screening mammogram for malignant neoplasm of breast: Secondary | ICD-10-CM

## 2019-11-02 ENCOUNTER — Other Ambulatory Visit: Payer: Self-pay

## 2019-11-02 ENCOUNTER — Ambulatory Visit
Admission: RE | Admit: 2019-11-02 | Discharge: 2019-11-02 | Disposition: A | Discharge status: Home / Self Care | Payer: Medicare PPO | Source: Ambulatory Visit | Attending: Student | Admitting: Student

## 2019-11-02 DIAGNOSIS — K2100 Gastro-esophageal reflux disease with esophagitis, without bleeding: Secondary | ICD-10-CM | POA: Diagnosis not present

## 2019-11-02 DIAGNOSIS — R1310 Dysphagia, unspecified: Secondary | ICD-10-CM | POA: Insufficient documentation

## 2019-11-02 DIAGNOSIS — K449 Diaphragmatic hernia without obstruction or gangrene: Secondary | ICD-10-CM | POA: Insufficient documentation

## 2019-11-02 DIAGNOSIS — K2970 Gastritis, unspecified, without bleeding: Secondary | ICD-10-CM | POA: Diagnosis present

## 2019-11-02 DIAGNOSIS — K225 Diverticulum of esophagus, acquired: Secondary | ICD-10-CM | POA: Insufficient documentation

## 2019-11-02 DIAGNOSIS — R1319 Other dysphagia: Secondary | ICD-10-CM

## 2019-12-24 ENCOUNTER — Ambulatory Visit
Admission: RE | Admit: 2019-12-24 | Discharge: 2019-12-24 | Disposition: A | Discharge status: Home / Self Care | Payer: Medicare PPO | Source: Ambulatory Visit | Attending: Surgery | Admitting: Surgery

## 2019-12-24 ENCOUNTER — Other Ambulatory Visit: Payer: Self-pay

## 2019-12-24 DIAGNOSIS — Z1231 Encounter for screening mammogram for malignant neoplasm of breast: Secondary | ICD-10-CM

## 2019-12-30 ENCOUNTER — Other Ambulatory Visit: Payer: Self-pay

## 2019-12-30 ENCOUNTER — Ambulatory Visit (INDEPENDENT_AMBULATORY_CARE_PROVIDER_SITE_OTHER): Payer: Medicare PPO | Admitting: Surgery

## 2019-12-30 ENCOUNTER — Encounter: Payer: Self-pay | Admitting: Surgery

## 2019-12-30 VITALS — BP 141/86 | HR 65 | Temp 98.2°F | Ht 69.0 in | Wt 268.2 lb

## 2019-12-30 DIAGNOSIS — N6092 Unspecified benign mammary dysplasia of left breast: Secondary | ICD-10-CM

## 2019-12-30 NOTE — Patient Instructions (Signed)
Dr.Pabon recommends patient to have repeat Mammogram and follow up in 1 year.  Breast Self-Awareness Breast self-awareness means being familiar with how your breasts look and feel. It involves checking your breasts regularly and reporting any changes to your health care provider. Practicing breast self-awareness is important. Sometimes changes may not be harmful (are benign), but sometimes a change in your breasts can be a sign of a serious medical problem. It is important to learn how to do this procedure correctly so that you can catch problems early, when treatment is more likely to be successful. All women should practice breast self-awareness, including women who have had breast implants. What you need:  A mirror.  A well-lit room. How to do a breast self-exam A breast self-exam is one way to learn what is normal for your breasts and whether your breasts are changing. To do a breast self-exam: Look for changes  1. Remove all the clothing above your waist. 2. Stand in front of a mirror in a room with good lighting. 3. Put your hands on your hips. 4. Push your hands firmly downward. 5. Compare your breasts in the mirror. Look for differences between them (asymmetry), such as: ? Differences in shape. ? Differences in size. ? Puckers, dips, and bumps in one breast and not the other. 6. Look at each breast for changes in the skin, such as: ? Redness. ? Scaly areas. 7. Look for changes in your nipples, such as: ? Discharge. ? Bleeding. ? Dimpling. ? Redness. ? A change in position. Feel for changes Carefully feel your breasts for lumps and changes. It is best to do this while lying on your back on the floor, and again while sitting or standing in the tub or shower with soapy water on your skin. Feel each breast in the following way: 1. Place the arm on the side of the breast you are examining above your head. 2. Feel your breast with the other hand. 3. Start in the nipple area and  make -inch (2 cm) overlapping circles to feel your breast. Use the pads of your three middle fingers to do this. Apply light pressure, then medium pressure, then firm pressure. The light pressure will allow you to feel the tissue closest to the skin. The medium pressure will allow you to feel the tissue that is a little deeper. The firm pressure will allow you to feel the tissue close to the ribs. 4. Continue the overlapping circles, moving downward over the breast until you feel your ribs below your breast. 5. Move one finger-width toward the center of the body. Continue to use the -inch (2 cm) overlapping circles to feel your breast as you move slowly up toward your collarbone. 6. Continue the up-and-down exam using all three pressures until you reach your armpit.  Write down what you find Writing down what you find can help you remember what to discuss with your health care provider. Write down:  What is normal for each breast.  Any changes that you find in each breast, including: ? The kind of changes you find. ? Any pain or tenderness. ? Size and location of any lumps.  Where you are in your menstrual cycle, if you are still menstruating. General tips and recommendations  Examine your breasts every month.  If you are breastfeeding, the best time to examine your breasts is after a feeding or after using a breast pump.  If you menstruate, the best time to examine your breasts is 5-7  days after your period. Breasts are generally lumpier during menstrual periods, and it may be more difficult to notice changes.  With time and practice, you will become more familiar with the variations in your breasts and more comfortable with the exam. Contact a health care provider if you:  See a change in the shape or size of your breasts or nipples.  See a change in the skin of your breast or nipples, such as a reddened or scaly area.  Have unusual discharge from your nipples.  Find a lump or  thick area that was not there before.  Have pain in your breasts.  Have any concerns related to your breast health. Summary  Breast self-awareness includes looking for physical changes in your breasts, as well as feeling for any changes within your breasts.  Breast self-awareness should be performed in front of a mirror in a well-lit room.  You should examine your breasts every month. If you menstruate, the best time to examine your breasts is 5-7 days after your menstrual period.  Let your health care provider know of any changes you notice in your breasts, including changes in size, changes on the skin, pain or tenderness, or unusual fluid from your nipples. This information is not intended to replace advice given to you by your health care provider. Make sure you discuss any questions you have with your health care provider. Document Revised: 11/05/2017 Document Reviewed: 11/05/2017 Elsevier Patient Education  [employer].

## 2020-01-01 ENCOUNTER — Encounter: Payer: Self-pay | Admitting: Surgery

## 2020-01-01 NOTE — Progress Notes (Signed)
Outpatient Surgical Follow Up  01/01/2020  Annette Bruce is an 69 y.o. female.   Chief Complaint  Patient presents with  . Follow-up    1 yr f/u rec Bil Screen Mamm[facility] 12/24/19    HPI: Annette Bruce is a 69 yo female with hx of ALH after excisional biopsy Left breast by Dr. Bary Castilla 2019. She denies any breast masses, DC or pain. Mammo pers. Reviewed showing no evidence of concerning lesions. He denies any fevers any chills any weight loss.  She does have mother and sister hx breast CA and sister died at age 12.   Past Medical History:  Diagnosis Date  . Allergic rhinitis   . Atypical lobular hyperplasia (ALH) of breast 01/20/2018  . Colon polyps   . GERD (gastroesophageal reflux disease)   . History of hiatal hernia    SMALL PER PT  . Hyperlipemia   . Hypertension   . Postmenopausal bleeding   . Rheumatoid arthritis ([place])    Dr Jefm Bryant  . Vitamin D deficiency     Past Surgical History:  Procedure Laterality Date  . APPENDECTOMY    . BREAST BIOPSY Left 01/02/2018   x marker, LOQ, CYSTIC PAPILLARY APOCRINE METAPLASIA.   Marland Kitchen BREAST BIOPSY Left 01/02/2018   coil marker, UIQ, ATYPICAL LOBULAR HYPERPLASIA, CYSTIC PAPILLARY APOCRINE METAPLASIA  . BREAST BIOPSY Left 01/20/2018   Procedure: BREAST BIOPSY;  Surgeon: Robert Bellow, MD;  Location: [facility] ORS;  Service: General;  Laterality: Left;  . BREAST CYST ASPIRATION Left 1970's   negative  . BREAST LUMPECTOMY Left 01/20/2018   Procedure: BREAST WIDE EXCISION;  Surgeon: Robert Bellow, MD;  Location: [facility] ORS;  Service: General;  Laterality: Left;  . COLONOSCOPY WITH PROPOFOL N/A 12/16/2015   Procedure: COLONOSCOPY WITH PROPOFOL;  Surgeon: Lollie Sails, MD;  Location[facility] ENDOSCOPY;  Service: Endoscopy;  Laterality: N/A;  . ESOPHAGOGASTRODUODENOSCOPY (EGD) WITH PROPOFOL N/A 12/16/2015   Procedure: ESOPHAGOGASTRODUODENOSCOPY (EGD) WITH PROPOFOL;  Surgeon: Lollie Sails, MD;  Location: [facility] ENDOSCOPY;  Service:  Endoscopy;  Laterality: N/A;  . TONSILLECTOMY      Family History  Problem Relation Age of Onset  . Colon cancer Mother 23  . Breast cancer Sister 18  . Breast cancer Maternal Aunt 70    Social History:  reports that she has never smoked. She has never used smokeless tobacco. She reports that she does not drink alcohol and does not use drugs.  Allergies:  Allergies  Allergen Reactions  . Doxycycline Hives    Medications reviewed.    ROS Full ROS performed and is otherwise negative other than what is stated in HPI   BP (!) 141/86   Pulse 65   Temp 98.2 F (36.8 C) (Oral)   Ht 5\' 9"  (1.753 m)   Wt 268 lb 3.2 oz (121.7 kg)   SpO2 96%   BMI 39.61 kg/m   Physical Exam Physical Exam Eyes:     General: No scleral icterus.       Right eye: No discharge.        Left eye: No discharge.     Extraocular Movements: Extraocular movements intact.  Neck:     Musculoskeletal: Normal range of motion and neck supple. No neck rigidity or muscular tenderness.  Cardiovascular:     Rate and Rhythm: Normal rate and regular rhythm.     Pulses: Normal pulses.  Pulmonary:     Effort: Pulmonary effort is normal. No respiratory distress.     Breath  sounds: No stridor. No wheezing.     Comments: BREAST: NO masses. nml nipple and skin. No axillary LAD. Previous lumpectomy scar on the left breast Abdominal:     General: Abdomen is flat. There is no distension.     Palpations: There is no mass.     Tenderness: There is no abdominal tenderness.     Hernia: No hernia is present.  Lymphadenopathy:     Cervical: No cervical adenopathy.  Skin:    Capillary Refill: Capillary refill takes less than 2 seconds.  Neurological:     General: No focal deficit present.     Mental Status: She is oriented to person, place, and time.  Psychiatric:        Mood and Affect: Mood normal.        Behavior: Behavior normal.        Thought Content: Thought content normal.        Judgment: Judgment normal.     Assessment/Plan: 70 year old female with a history of left breast atypical lobular hyperplasia status post excisional biopsy in 2019.  Currently there is no evidence of any suspicious lesions.  We will continue yearly mammogram and physical exams.  All questions were answered.  Greater than 50% of the 25 minutes  visit was spent in counseling/coordination of care   Caroleen Hamman, MD [place] [facility]

## 2020-02-04 ENCOUNTER — Ambulatory Visit: Payer: Medicare PPO | Admitting: Dermatology

## 2020-02-04 ENCOUNTER — Other Ambulatory Visit: Payer: Self-pay

## 2020-02-04 DIAGNOSIS — L304 Erythema intertrigo: Secondary | ICD-10-CM

## 2020-02-04 DIAGNOSIS — L409 Psoriasis, unspecified: Secondary | ICD-10-CM

## 2020-02-04 DIAGNOSIS — L408 Other psoriasis: Secondary | ICD-10-CM

## 2020-02-04 DIAGNOSIS — L821 Other seborrheic keratosis: Secondary | ICD-10-CM

## 2020-02-04 DIAGNOSIS — L578 Other skin changes due to chronic exposure to nonionizing radiation: Secondary | ICD-10-CM

## 2020-02-04 DIAGNOSIS — L4050 Arthropathic psoriasis, unspecified: Secondary | ICD-10-CM | POA: Diagnosis not present

## 2020-02-04 DIAGNOSIS — Z1283 Encounter for screening for malignant neoplasm of skin: Secondary | ICD-10-CM

## 2020-02-04 DIAGNOSIS — D229 Melanocytic nevi, unspecified: Secondary | ICD-10-CM

## 2020-02-04 DIAGNOSIS — L820 Inflamed seborrheic keratosis: Secondary | ICD-10-CM

## 2020-02-04 DIAGNOSIS — L814 Other melanin hyperpigmentation: Secondary | ICD-10-CM

## 2020-02-04 DIAGNOSIS — D1800 Hemangioma unspecified site: Secondary | ICD-10-CM

## 2020-02-04 MED ORDER — KETOCONAZOLE 2 % EX CREA: 1.0000 "application " | TOPICAL_CREAM | CUTANEOUS | 6 refills | Status: DC

## 2020-02-04 MED ORDER — MOMETASONE FUROATE 0.1 % EX CREA: 1.0000 "application " | TOPICAL_CREAM | CUTANEOUS | 6 refills | Status: DC

## 2020-02-04 NOTE — Patient Instructions (Signed)

## 2020-02-04 NOTE — Progress Notes (Signed)
Follow-Up Visit   Subjective  Annette Bruce is a 69 y.o. female who presents for the following: Annual Exam (No history of skin cancer or abnormal moles - TBSE today) and Other (Some skin breakouts. She was on Methotrexate and humira then just Humira for inflammatory arthritis. SHe has been off x ~8 months. She has never been diagnosed with psoriasi ). The patient presents for Total-Body Skin Exam (TBSE) for skin cancer screening and mole check.  The following portions of the chart were reviewed this encounter and updated as appropriate:  Tobacco  Allergies  Meds  Problems  Med Hx  Surg Hx  Fam Hx     Review of Systems:  No other skin or systemic complaints except as noted in HPI or Assessment and Plan.  Objective  Well appearing patient in no apparent distress; mood and affect are within normal limits.  A full examination was performed including scalp, head, eyes, ears, nose, lips, neck, chest, axillae, abdomen, back, buttocks, bilateral upper extremities, bilateral lower extremities, hands, feet, fingers, toes, fingernails, and toenails. All findings within normal limits unless otherwise noted below.  Objective  inframammary areas, groin area, axilla: Erythema and scale  Objective  Buttock x 7, right chest x 1 (8): Erythematous keratotic or waxy stuck-on papule or plaque.   Objective  Legs: Small guttate pink plaques.   Assessment & Plan    Lentigines - Scattered tan macules - Discussed due to sun exposure - Benign, observe - Call for any changes  Seborrheic Keratoses - Stuck-on, waxy, tan-brown papules and plaques  - Discussed benign etiology and prognosis. - Observe - Call for any changes  Melanocytic Nevi - Tan-brown and/or pink-flesh-colored symmetric macules and papules - Benign appearing on exam today - Observation - Call clinic for new or changing moles - Recommend daily use of broad spectrum spf 30+ sunscreen to sun-exposed areas.    Hemangiomas - Red papules - Discussed benign nature - Observe - Call for any changes  Actinic Damage - Chronic, secondary to cumulative UV/sun exposure - diffuse scaly erythematous macules with underlying dyspigmentation - Recommend daily broad spectrum sunscreen SPF 30+ to sun-exposed areas, reapply every 2 hours as needed.  - Call for new or changing lesions.  Skin cancer screening performed today.  Erythema intertrigo inframammary areas, groin area, axilla With Psoriasis Inversa and psoriatic arthritis Start Ketoconazole 2% cream qod alternating with mometasone cream every other day  ketoconazole (NIZORAL) 2 % cream - inframammary areas, groin area, axilla  Inflamed seborrheic keratosis (8) Buttock x 7, right chest x 1  Destruction of lesion - Buttock x 7, right chest x 1 Complexity: simple   Destruction method: cryotherapy   Informed consent: discussed and consent obtained   Timeout:  patient name, date of birth, surgical site, and procedure verified Lesion destroyed using liquid nitrogen: Yes   Region frozen until ice ball extended beyond lesion: Yes   Outcome: patient tolerated procedure well with no complications   Post-procedure details: wound care instructions given    Psoriasis Legs With psoriatic arthritis Psoriasis is a chronic non-curable, but treatable genetic/hereditary disease that may have other systemic features affecting other organ systems such as joints (Psoriatic Arthritis). It is associated with an increased risk of inflammatory bowel disease, heart disease, non-alcoholic fatty liver disease, and depression.    Start Mometasone cream qod  mometasone (ELOCON) 0.1 % cream - Legs  Her rheumatologist will continue to follow and treat her psoriatic arthritis.  Return in about 4 months (  around 06/03/2020) for Follow up.  I, Ashok Cordia, CMA, am acting as scribe for [name], MD .  Documentation: I have reviewed the above documentation for  accuracy and completeness, and I agree with the above.  [name], MD

## 2020-02-05 ENCOUNTER — Encounter: Payer: Self-pay | Admitting: Dermatology

## 2020-04-15 ENCOUNTER — Telehealth: Payer: Self-pay | Admitting: Unknown Physician Specialty

## 2020-04-15 ENCOUNTER — Encounter: Payer: Self-pay | Admitting: Unknown Physician Specialty

## 2020-04-15 ENCOUNTER — Other Ambulatory Visit: Payer: Self-pay | Admitting: Unknown Physician Specialty

## 2020-04-15 ENCOUNTER — Telehealth: Payer: Self-pay

## 2020-04-15 MED ORDER — MOLNUPIRAVIR EUA 200MG CAPSULE: 4.0000 | ORAL_CAPSULE | Freq: Two times a day (BID) | ORAL | 0 refills | Status: AC

## 2020-04-15 MED ORDER — NIRMATRELVIR/RITONAVIR (PAXLOVID)TABLET: 3.0000 | ORAL_TABLET | Freq: Two times a day (BID) | ORAL | 0 refills | Status: AC

## 2020-04-15 MED ORDER — NIRMATRELVIR/RITONAVIR (PAXLOVID)TABLET: 3.0000 | ORAL_TABLET | Freq: Two times a day (BID) | ORAL | 0 refills | Status: DC

## 2020-04-15 NOTE — Telephone Encounter (Signed)
Patient was prescribed oral covid treatment molnupiravir and treatment note was reviewed. Medication has been received by [place] and reviewed for appropriateness.  Drug Interactions or Dosage Adjustments Noted: Patient is post-menopausal.  Delivery Method: Pick-up  Patient contacted for counseling on 04/15/20 and verbalized understanding.   Delivery or Pick-Up Date: 04/15/20   Carolynne Edouard 04/15/2020, 1:50 PM [facility] Outpatient Pharmacist Phone# [phone]

## 2020-04-15 NOTE — Telephone Encounter (Signed)
Outpatient Oral COVID Treatment Note  I connected with Annette Bruce on 04/15/2020/10:54 AM by telephone and verified that I am speaking with the correct person using two identifiers.  I discussed the limitations, risks, security, and privacy concerns of performing an evaluation and management service by telephone and the availability of in person appointments. I also discussed with the patient that there may be a patient responsible charge related to this service. The patient expressed understanding and agreed to proceed.  Patient location: home Provider location: home  Diagnosis: COVID-19 infection  Purpose of visit: Discussion of potential use of Molnupiravir or Paxlovid, a new treatment for mild to moderate COVID-19 viral infection in non-hospitalized patients.   Subjective: Patient is a 70 y.o. female who has been diagnosed with COVID 19 viral infection.  Their symptoms began on  1/12 with URI sxs.    Past Medical History:  Diagnosis Date  . Allergic rhinitis   . Atypical lobular hyperplasia (ALH) of breast 01/20/2018  . Colon polyps   . GERD (gastroesophageal reflux disease)   . History of hiatal hernia    SMALL PER PT  . Hyperlipemia   . Hypertension   . Postmenopausal bleeding   . Rheumatoid arthritis ([place])    Dr Jefm Bryant  . Vitamin D deficiency     Allergies  Allergen Reactions  . Doxycycline Hives     Current Outpatient Medications:  .  Adalimumab (HUMIRA PEN) 40 MG/0.8ML PNKT, Inject into the skin every 14 (fourteen) days. (Patient not taking: Reported on 12/30/2019), Disp: , Rfl:  .  aspirin EC 81 MG tablet, Take 81 mg by mouth daily. (Patient not taking: Reported on 12/30/2019), Disp: , Rfl:  .  Calcium 500 MG CHEW, Chew 2 tablets by mouth daily. (Patient not taking: Reported on 12/30/2019), Disp: , Rfl:  .  Calcium-Vitamin D-Vitamin K (CALCIUM FOR WOMEN) 500-100-40 MG-UNT-MCG CHEW, Chew by mouth., Disp: , Rfl:  .  Cholecalciferol (VITAMIN D3) 125 MCG (5000 UT)  TABS, Take by mouth daily. (Patient not taking: Reported on 12/30/2019), Disp: , Rfl:  .  esomeprazole (NEXIUM) 40 MG capsule, Take 40 mg by mouth every morning.  (Patient not taking: Reported on 12/30/2019), Disp: , Rfl:  .  fluticasone (FLONASE) 50 MCG/ACT nasal spray, Place 2 sprays into both nostrils daily as needed for allergies or rhinitis., Disp: , Rfl:  .  hydrochlorothiazide (HYDRODIURIL) 25 MG tablet, Take 25 mg by mouth daily., Disp: , Rfl:  .  mometasone (ELOCON) 0.1 % cream, Apply 1 application topically every other day., Disp: 45 g, Rfl: 6 .  Multiple Vitamin (MULTIVITAMIN ADULT PO), Take by mouth., Disp: , Rfl:  .  pantoprazole (PROTONIX) 40 MG tablet, Take by mouth., Disp: , Rfl:  .  Probiotic Product (ALIGN PO), Take by mouth daily., Disp: , Rfl:  .  simvastatin (ZOCOR) 20 MG tablet, Take 20 mg by mouth daily at 6 PM. , Disp: , Rfl:  .  tamoxifen (NOLVADEX) 20 MG tablet, Take 1 tablet (20 mg total) by mouth daily. (Patient not taking: Reported on 12/30/2019), Disp: 90 tablet, Rfl: 3 .  tamoxifen (NOLVADEX) 20 MG tablet, Take 1 tablet (20 mg total) by mouth daily. (Patient not taking: Reported on 12/30/2019), Disp: 30 tablet, Rfl: 12  Objective: Patient appears/sounds well.  They are in no apparent distress.  Breathing is non labored.  Mood and behavior are normal.  Laboratory Data:  Creat from care everywhere WNL   Assessment: 70 y.o. female with mild/moderate COVID 19  viral infection diagnosed on 1/12 at high risk for progression to severe COVID 19.  Plan:  This patient is a 70 y.o. female that meets the following criteria for Emergency Use Authorization of: Paxlovid 1. Age >12 yr AND > 40 kg 2. SARS-COV-2 positive test 3. Symptom onset < 5 days 4. Mild-to-moderate COVID disease with high risk for severe progression to hospitalization or death  I have spoken and communicated the following to the patient or parent/caregiver regarding: 1. Paxlovid is an unapproved drug that is  authorized for use under an Emergency Use Authorization.  2. There are no adequate, approved, available products for the treatment of COVID-19 in adults who have mild-to-moderate COVID-19 and are at high risk for progressing to severe COVID-19, including hospitalization or death. 3. Other therapeutics are currently authorized. For additional information on all products authorized for treatment or prevention of COVID-19, please see [employer].  4. There are benefits and risks of taking this treatment as outlined in the "Fact Sheet for Patients and Caregivers."  5. "Fact Sheet for Patients and Caregivers" was reviewed with patient. A hard copy will be provided to patient from pharmacy prior to the patient receiving treatment. 6. Patients should continue to self-isolate and use infection control measures (e.g., wear mask, isolate, social distance, avoid sharing personal items, clean and disinfect "high touch" surfaces, and frequent handwashing) according to CDC guidelines.  7. The patient or parent/caregiver has the option to accept or refuse treatment. 8. Patient medication history was reviewed for potential drug interactions:Interaction with home meds: Fluticasone and statin.  She will hold 9. Patient's creatinine clearance was calculated to be 129, and they were therefore prescribed Normal dose (CrCl>60) - nirmatrelvir 150mg  tab (2 tablet) by mouth twice daily AND ritonavir 100mg  tab (1 tablet) by mouth twice daily   After reviewing above information with the patient, the patient agrees to receive Paxlovid.  Follow up instructions:    . Take prescription BID x 5 days as directed . Reach out to pharmacist for counseling on medication if desired . For concerns regarding further COVID symptoms please follow up with your PCP or urgent care . For urgent or life-threatening issues, seek care  at your local emergency department  The patient was provided an opportunity to ask questions, and all were answered. The patient agreed with the plan and demonstrated an understanding of the instructions.   Script sent to [facility] and opted to pick up RX.  The patient was advised to call their PCP or seek an in-person evaluation if the symptoms worsen or if the condition fails to improve as anticipated.   I provided 20 minutes of non face-to-face telephone visit time during this encounter, and > 50% was spent counseling as documented under my assessment & plan.  Kathrine Haddock, NP 04/15/2020 /10:54 AM

## 2020-04-15 NOTE — Telephone Encounter (Signed)
Addendum, pharmacy is out of Paxlovid.  We will rx Molnupiravir.  Pt called and is agreeable to the change in treatment.

## 2020-06-08 ENCOUNTER — Other Ambulatory Visit: Payer: Self-pay

## 2020-06-08 ENCOUNTER — Encounter: Payer: Self-pay | Admitting: Dermatology

## 2020-06-08 ENCOUNTER — Ambulatory Visit: Payer: Medicare PPO | Admitting: Dermatology

## 2020-06-08 DIAGNOSIS — L4050 Arthropathic psoriasis, unspecified: Secondary | ICD-10-CM

## 2020-06-08 DIAGNOSIS — L820 Inflamed seborrheic keratosis: Secondary | ICD-10-CM | POA: Diagnosis not present

## 2020-06-08 DIAGNOSIS — L409 Psoriasis, unspecified: Secondary | ICD-10-CM | POA: Diagnosis not present

## 2020-06-08 DIAGNOSIS — L304 Erythema intertrigo: Secondary | ICD-10-CM | POA: Diagnosis not present

## 2020-06-08 DIAGNOSIS — L821 Other seborrheic keratosis: Secondary | ICD-10-CM

## 2020-06-08 DIAGNOSIS — L408 Other psoriasis: Secondary | ICD-10-CM

## 2020-06-08 NOTE — Progress Notes (Signed)
   Follow-Up Visit   Subjective  Annette Bruce is a 70 y.o. female who presents for the following: Erythema intertrigo with Psoriasis inversa and PA (Inframammary, groin, axilla, Ketoconazole cr 2% qod alternating with Mometasone cr, improved) and Psoriasis (With PA, legs, not using any topicals, restarted Humira x 1 month).  The following portions of the chart were reviewed this encounter and updated as appropriate:   Tobacco  Allergies  Meds  Problems  Med Hx  Surg Hx  Fam Hx     Review of Systems:  No other skin or systemic complaints except as noted in HPI or Assessment and Plan.  Objective  Well appearing patient in no apparent distress; mood and affect are within normal limits.  A focused examination was performed including bil axilla, inframammary, groin, legs. Relevant physical exam findings are noted in the Assessment and Plan.  Objective  bil axilla, inframammary, groin: Pinkness and mild scale in axillary vault, inframammary and groin area clear  Objective  bil legs: Pink patches legs  Objective  Left Popliteal Fossa x 2 (2): Erythematous keratotic or waxy stuck-on papule or plaque.    Assessment & Plan   Seborrheic Keratoses - Stuck-on, waxy, tan-brown papules and plaques  - Discussed benign etiology and prognosis. - Observe - Call for any changes  Erythema intertrigo with psoriasis inversa bil axilla, inframammary, groin With Psoriasis inversa and psoriatic arthritis Improved with topical cr and restarting Humira x 1 month  Cont Ketoconazole 2% cream qod to axilla until clear, then prn flares Cont Mometasone cr qod to axilla until clear, then prn flares Cont Humira as prescribed by Dr. Denyce Bruce is a chronic recurrent rash that occurs in skin fold areas that may be associated with friction; heat; moisture; yeast; fungus; and bacteria.  It is exacerbated by increased movement / activity; sweating; and higher atmospheric  temperature.  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 Jefm Bryant   Psoriasis is a chronic non-curable, but treatable genetic/hereditary disease that may have other systemic features affecting other organ systems such as joints (Psoriatic Arthritis). It is associated with an increased risk of inflammatory bowel disease, heart disease, non-alcoholic fatty liver disease, and depression.     Start Mometasone cr qd up to 5d/wk to aa legs until clear, then prn flares  Other Related Medications mometasone (ELOCON) 0.1 % cream  Inflamed seborrheic keratosis (2) Left Popliteal Fossa x 2 Destruction of lesion - Left Popliteal Fossa x 2 Complexity: simple   Destruction method: cryotherapy   Informed consent: discussed and consent obtained   Timeout:  patient name, date of birth, surgical site, and procedure verified Lesion destroyed using liquid nitrogen: Yes   Region frozen until ice ball extended beyond lesion: Yes   Outcome: patient tolerated procedure well with no complications   Post-procedure details: wound care instructions given    Return in about 8 months (around 02/08/2021) for TBSE.   I, Annette Bruce, RMA, am acting as scribe for Annette Ser, MD .  Documentation: I have reviewed the above documentation for accuracy and completeness, and I agree with the above.  Annette Ser, MD

## 2020-07-07 ENCOUNTER — Encounter: Payer: Self-pay | Admitting: Podiatry

## 2020-07-07 ENCOUNTER — Other Ambulatory Visit: Payer: Self-pay

## 2020-07-07 ENCOUNTER — Ambulatory Visit: Payer: Medicare PPO | Admitting: Podiatry

## 2020-07-07 DIAGNOSIS — L600 Ingrowing nail: Secondary | ICD-10-CM

## 2020-07-07 NOTE — Progress Notes (Signed)
Subjective:  Patient ID: Annette Bruce, female    DOB: 1950-09-22,  MRN: [id number]  Chief Complaint  Patient presents with  . Ingrown Toenail  . Bunions    Patient presents today for ingrown toenails left hallux lateral border x 1 week.  She also has bunions bilat that cause discomfort sometimes    70 y.o. female presents with the above complaint.  Patient presents with complaint of left lateral border ingrown.  Patient states is painful to touch is going on for 1 week.  She states that she has tried doing some soaking none of which has helped.  She would like to have it removed.  She has not seen anyone else prior to seeing me.  She denies any other acute complaints.  She would like to have removed.   Review of Systems: Negative except as noted in the HPI. Denies N/V/F/Ch.  Past Medical History:  Diagnosis Date  . Allergic rhinitis   . Atypical lobular hyperplasia (ALH) of breast 01/20/2018  . Colon polyps   . GERD (gastroesophageal reflux disease)   . History of hiatal hernia    SMALL PER PT  . Hyperlipemia   . Hypertension   . Postmenopausal bleeding   . Rheumatoid arthritis ([place])    Dr Jefm Bryant  . Vitamin D deficiency     Current Outpatient Medications:  .  simvastatin (ZOCOR) 40 MG tablet, Take 40 mg by mouth daily., Disp: , Rfl:  .  Adalimumab (HUMIRA PEN) 40 MG/0.8ML PNKT, Inject into the skin every 14 (fourteen) days. (Patient not taking: Reported on 12/30/2019), Disp: , Rfl:  .  Calcium 500 MG CHEW, Chew 2 tablets by mouth daily. (Patient not taking: Reported on 12/30/2019), Disp: , Rfl:  .  Calcium-Vitamin D-Vitamin K (CALCIUM FOR WOMEN) 500-100-40 MG-UNT-MCG CHEW, Chew by mouth., Disp: , Rfl:  .  Cholecalciferol (VITAMIN D3) 125 MCG (5000 UT) TABS, Take by mouth daily. (Patient not taking: Reported on 12/30/2019), Disp: , Rfl:  .  hydrochlorothiazide (HYDRODIURIL) 25 MG tablet, Take 25 mg by mouth daily., Disp: , Rfl:  .  mometasone (ELOCON) 0.1 % cream, Apply 1  application topically every other day., Disp: 45 g, Rfl: 6 .  Multiple Vitamin (MULTIVITAMIN ADULT PO), Take by mouth., Disp: , Rfl:  .  Nirmatrelvir & Ritonavir 20 x 150 MG & 10 x 100MG  TBPK, TAKE 3 TABLETS BY MOUTH 2 (TWO) TIMES DAILY FOR 5 DAYS., Disp: 30 each, Rfl: 0 .  pantoprazole (PROTONIX) 40 MG tablet, Take by mouth., Disp: , Rfl:  .  Probiotic Product (ALIGN PO), Take by mouth daily., Disp: , Rfl:   Social History   Tobacco Use  Smoking Status Never Smoker  Smokeless Tobacco Never Used    Allergies  Allergen Reactions  . Doxycycline Hives   Objective:  There were no vitals filed for this visit. There is no height or weight on file to calculate BMI. Constitutional Well developed. Well nourished.  Vascular Dorsalis pedis pulses palpable bilaterally. Posterior tibial pulses palpable bilaterally. Capillary refill normal to all digits.  No cyanosis or clubbing noted. Pedal hair growth normal.  Neurologic Normal speech. Oriented to person, place, and time. Epicritic sensation to light touch grossly present bilaterally.  Dermatologic Painful ingrowing nail at lateral nail borders of the hallux nail left. No other open wounds. No skin lesions.  Orthopedic: Normal joint ROM without pain or crepitus bilaterally. No visible deformities. No bony tenderness.   Radiographs: None Assessment:   1. Ingrown left big  toenail    Plan:  Patient was evaluated and treated and all questions answered.  Ingrown Nail, left -Patient elects to proceed with minor surgery to remove ingrown toenail removal today. Consent reviewed and signed by patient. -Ingrown nail excised. See procedure note. -Educated on post-procedure care including soaking. Written instructions provided and reviewed. -Patient to follow up in 2 weeks for nail check.  Procedure: Excision of Ingrown Toenail Location: Left 1st toe lateral nail borders. Anesthesia: Lidocaine 1% plain; 1.5 mL and Marcaine 0.5% plain;  1.5 mL, digital block. Skin Prep: Betadine. Dressing: Silvadene; telfa; dry, sterile, compression dressing. Technique: Following skin prep, the toe was exsanguinated and a tourniquet was secured at the base of the toe. The affected nail border was freed, split with a nail splitter, and excised. Chemical matrixectomy was then performed with phenol and irrigated out with alcohol. The tourniquet was then removed and sterile dressing applied. Disposition: Patient tolerated procedure well. Patient to return in 2 weeks for follow-up.   No follow-ups on file.

## 2020-07-07 NOTE — Patient Instructions (Signed)

## 2020-07-27 IMAGING — MG MM DIGITAL SCREENING BILAT W/ TOMO W/ CAD
8 of 17 series · 8 of 37 positions shown · non-contrast
Comparison: Previous exam(s).

CLINICAL DATA: Screening.

EXAM:
DIGITAL SCREENING BILATERAL MAMMOGRAM WITH TOMO AND CAD

[L XCCL synth-2D]
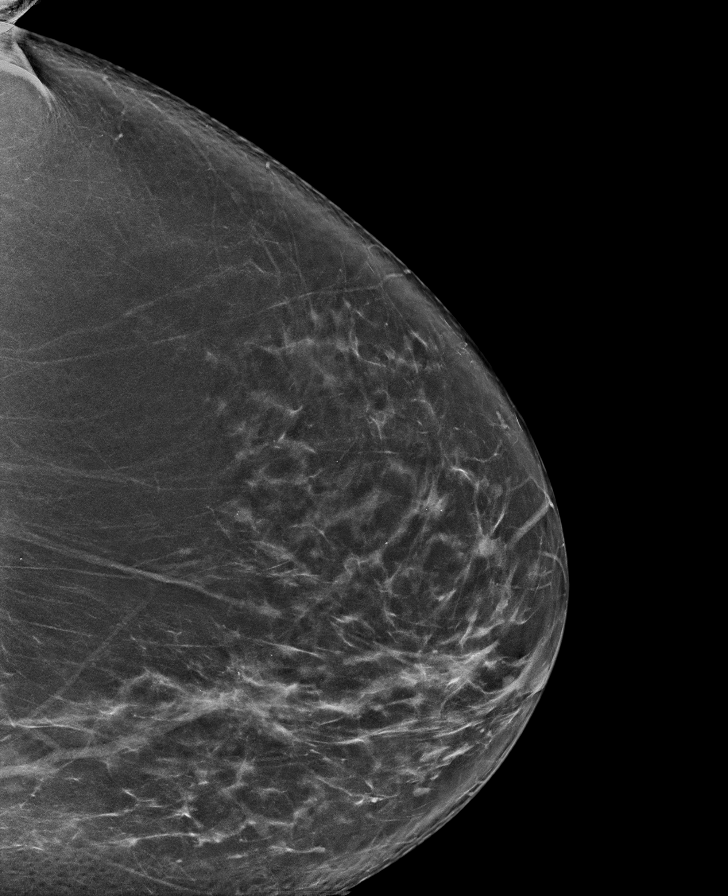

[L CC synth-2D]
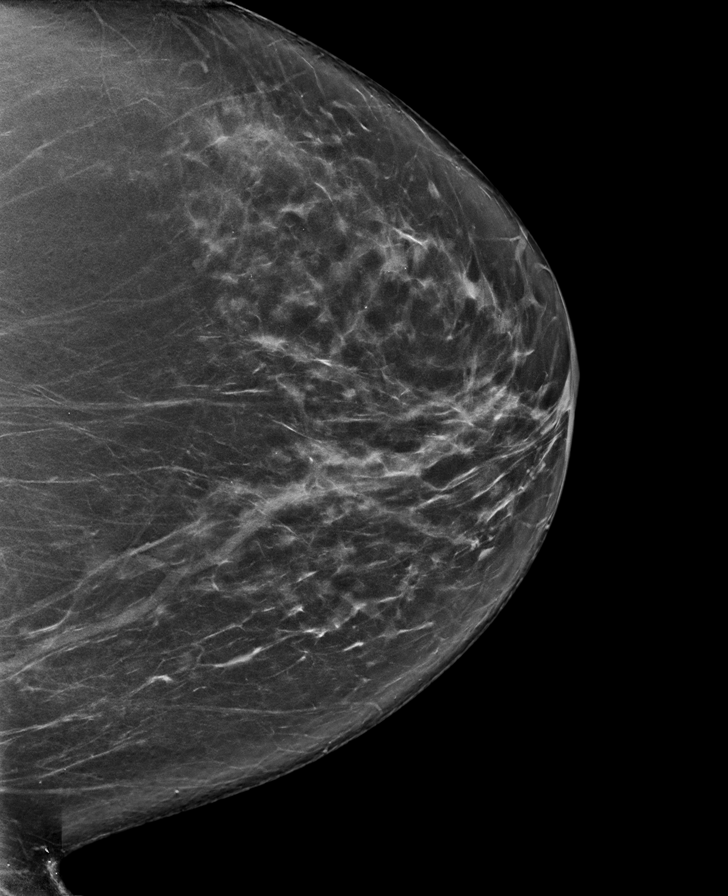

[R MLO synth-2D]
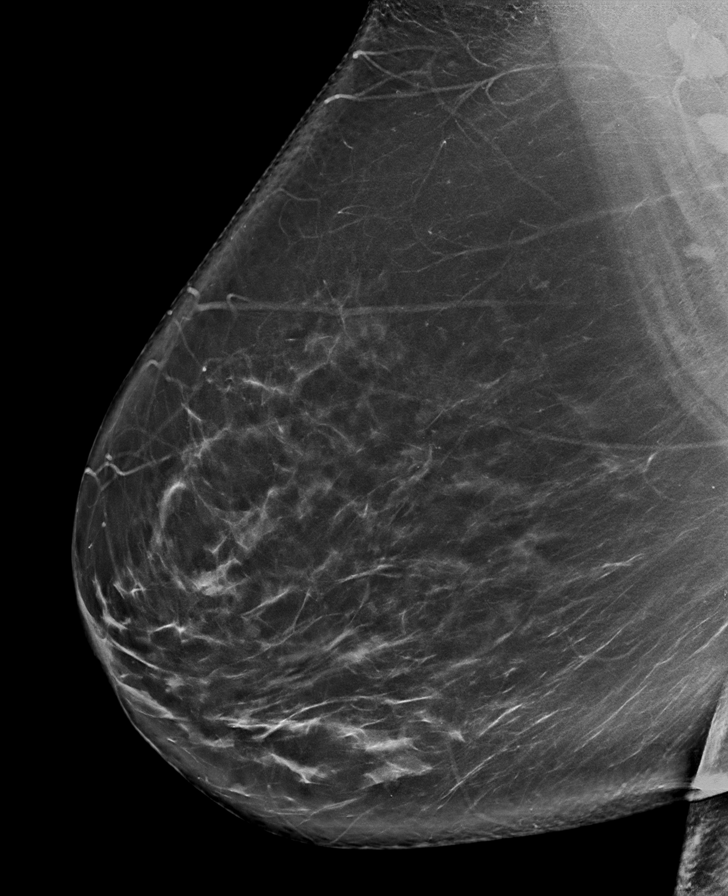

[R CC synth-2D]
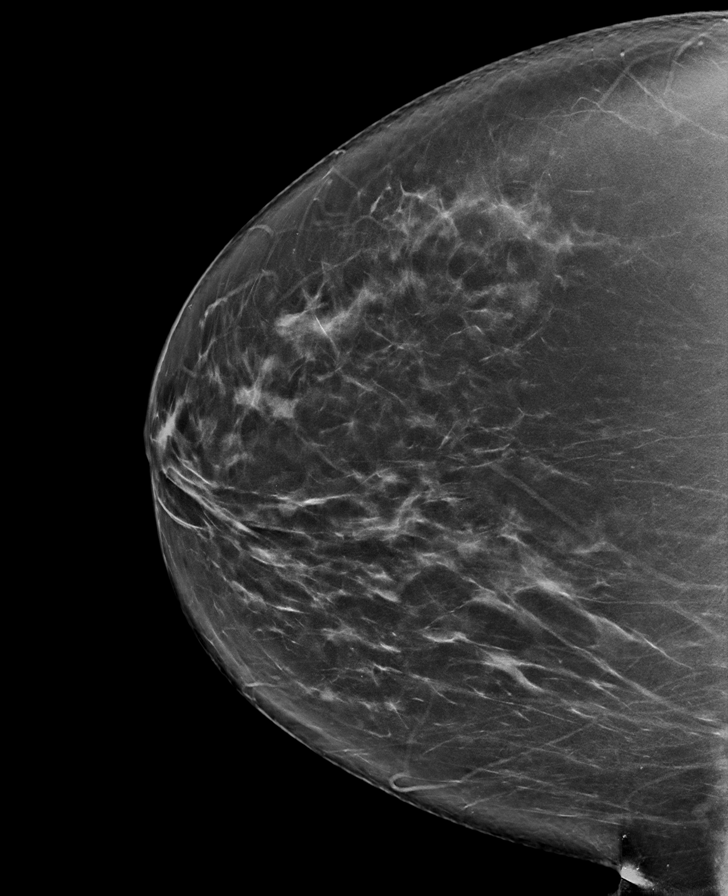

[L MLO synth-2D]
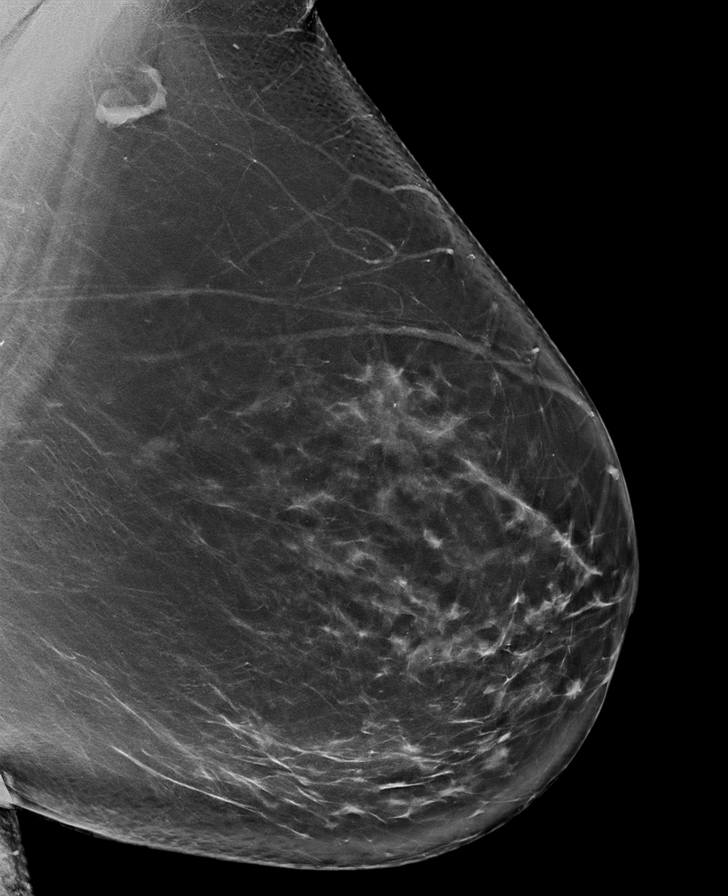

[L XCCL]
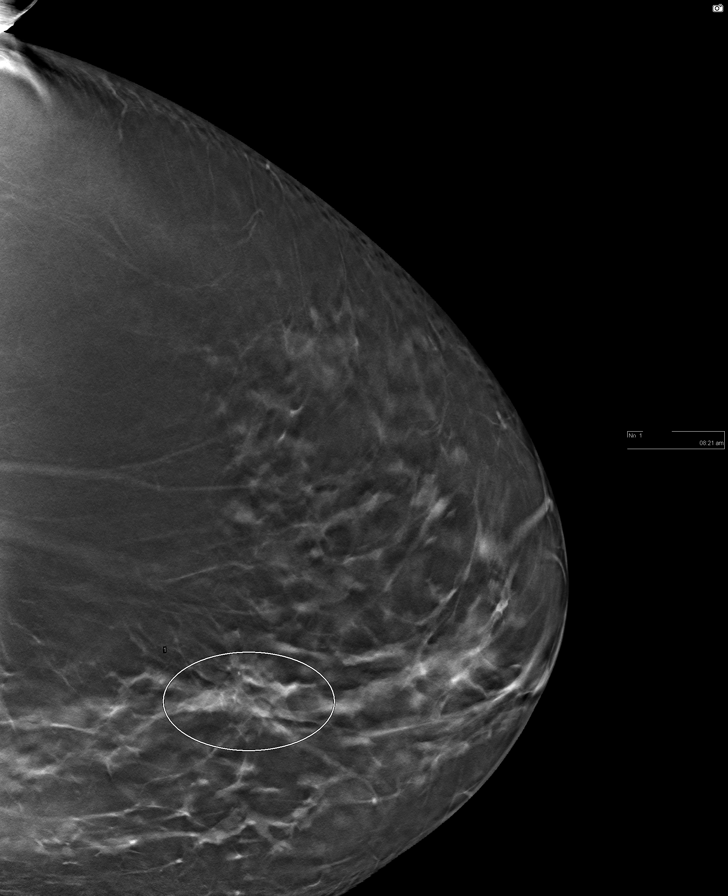

[L MLO]
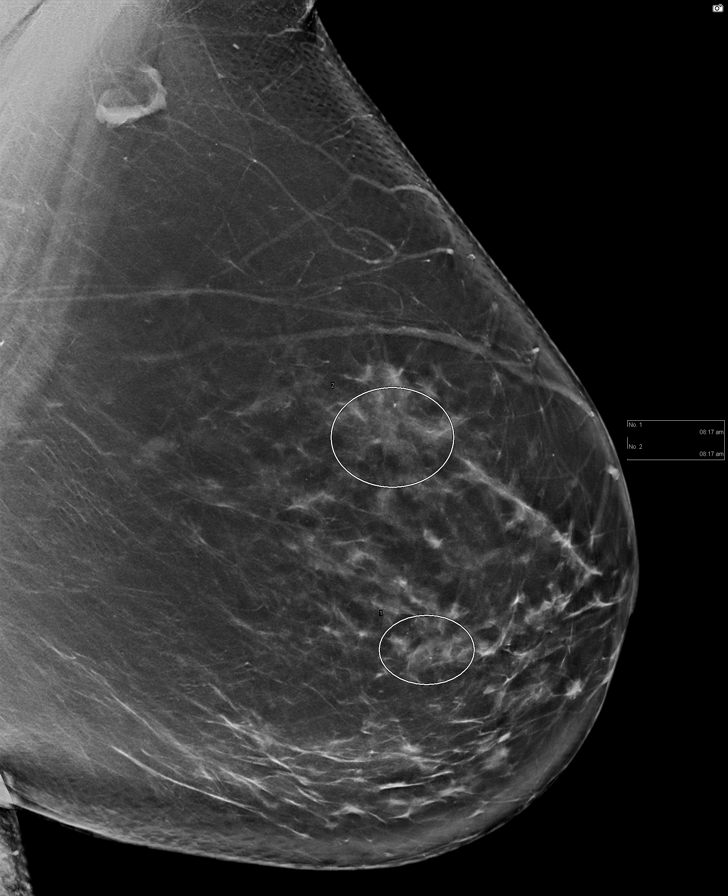

[L CC]
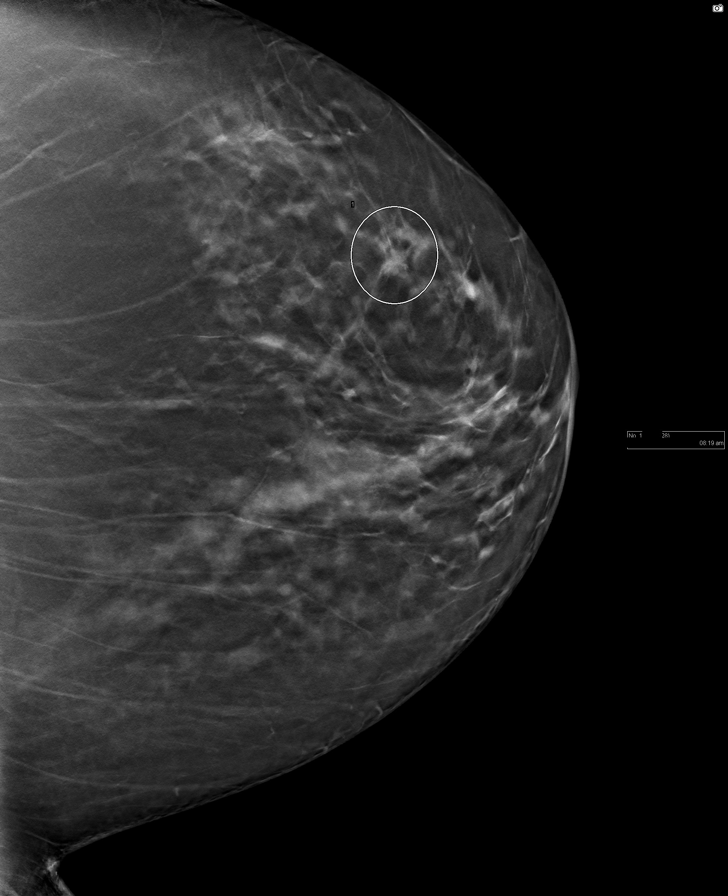

[8 of 37 positions shown; findings below may reference images not displayed]

ACR Breast Density Category b: There are scattered areas of
fibroglandular density.
FINDINGS: In the left breast, a possible mass with distortion warrants further
evaluation. This possible mass with distortion is seen within the
outer LEFT breast, anterior to middle depth, tomosynthesis CC slice
28, probable correlate within the slightly lower LEFT breast on the
MLO view slice 30.

There is additional possible architectural distortion within the
upper LEFT breast, best seen on MLO view, tomosynthesis slice 65,
possible correlate within the inner LEFT breast on exaggerated CC
slice 60.

In the right breast, no findings suspicious for malignancy.

Images were processed with CAD.
IMPRESSION: Further evaluation is suggested for possible mass with distortion in
the outer left breast and additional possible distortion within the
upper inner LEFT breast.

RECOMMENDATION:
Diagnostic mammogram and possibly ultrasound of the left breast.
(Code:V5-T-FF1)

The patient will be contacted regarding the findings, and additional
imaging will be scheduled.

BI-RADS CATEGORY  0: Incomplete. Need additional imaging evaluation
and/or prior mammograms for comparison.

## 2020-11-11 ENCOUNTER — Other Ambulatory Visit: Payer: Self-pay

## 2020-11-11 DIAGNOSIS — Z1231 Encounter for screening mammogram for malignant neoplasm of breast: Secondary | ICD-10-CM

## 2020-12-27 ENCOUNTER — Other Ambulatory Visit: Payer: Self-pay

## 2020-12-27 ENCOUNTER — Ambulatory Visit
Admission: RE | Admit: 2020-12-27 | Discharge: 2020-12-27 | Disposition: A | Discharge status: Home / Self Care | Payer: Medicare PPO | Source: Ambulatory Visit | Attending: Surgery | Admitting: Surgery

## 2020-12-27 DIAGNOSIS — Z1231 Encounter for screening mammogram for malignant neoplasm of breast: Secondary | ICD-10-CM

## 2021-01-04 ENCOUNTER — Ambulatory Visit: Payer: Medicare PPO | Admitting: Physician Assistant

## 2021-01-04 ENCOUNTER — Encounter: Payer: Self-pay | Admitting: Surgery

## 2021-01-04 ENCOUNTER — Other Ambulatory Visit: Payer: Self-pay

## 2021-01-04 VITALS — BP 147/91 | HR 64 | Temp 98.1°F | Ht 69.0 in | Wt 269.8 lb

## 2021-01-04 DIAGNOSIS — Z1231 Encounter for screening mammogram for malignant neoplasm of breast: Secondary | ICD-10-CM

## 2021-01-04 DIAGNOSIS — Z803 Family history of malignant neoplasm of breast: Secondary | ICD-10-CM

## 2021-01-04 DIAGNOSIS — Z1239 Encounter for other screening for malignant neoplasm of breast: Secondary | ICD-10-CM | POA: Diagnosis not present

## 2021-01-04 NOTE — Patient Instructions (Addendum)
We will contact you September 2023 for your mammogram and follow up breast exam with Dr.Pabon. if you do not hear from our office within the time frame listed above please call our office to schedule.    Breast Self-Awareness Breast self-awareness means being familiar with how your breasts look and feel. It involves checking your breasts regularly and reporting any changes to your health care provider. Practicing breast self-awareness is important. Sometimes changes may not be harmful (are benign), but sometimes a change in your breasts can be a sign of a serious medical problem. It is important to learn how to do this procedure correctly so that you can catch problems early, when treatment is more likely to be successful. All women should practice breast self-awareness, including women who have had breast implants. What you need: A mirror. A well-lit room. How to do a breast self-exam A breast self-exam is one way to learn what is normal for your breasts and whether your breasts are changing. To do a breast self-exam: Look for changes  Remove all the clothing above your waist. Stand in front of a mirror in a room with good lighting. Put your hands on your hips. Push your hands firmly downward. Compare your breasts in the mirror. Look for differences between them (asymmetry), such as: Differences in shape. Differences in size. Puckers, dips, and bumps in one breast and not the other. Look at each breast for changes in the skin, such as: Redness. Scaly areas. Look for changes in your nipples, such as: Discharge. Bleeding. Dimpling. Redness. A change in position. Feel for changes Carefully feel your breasts for lumps and changes. It is best to do this while lying on your back on the floor, and again while sitting or standing in the tub or shower with soapy water on your skin. Feel each breast in the following way: Place the arm on the side of the breast you are examining above your  head. Feel your breast with the other hand. Start in the nipple area and make -inch (2 cm) overlapping circles to feel your breast. Use the pads of your three middle fingers to do this. Apply light pressure, then medium pressure, then firm pressure. The light pressure will allow you to feel the tissue closest to the skin. The medium pressure will allow you to feel the tissue that is a little deeper. The firm pressure will allow you to feel the tissue close to the ribs. Continue the overlapping circles, moving downward over the breast until you feel your ribs below your breast. Move one finger-width toward the center of the body. Continue to use the -inch (2 cm) overlapping circles to feel your breast as you move slowly up toward your collarbone. Continue the up-and-down exam using all three pressures until you reach your armpit.  Write down what you find Writing down what you find can help you remember what to discuss with your health care provider. Write down: What is normal for each breast. Any changes that you find in each breast, including: The kind of changes you find. Any pain or tenderness. Size and location of any lumps. Where you are in your menstrual cycle, if you are still menstruating. General tips and recommendations Examine your breasts every month. If you are breastfeeding, the best time to examine your breasts is after a feeding or after using a breast pump. If you menstruate, the best time to examine your breasts is 5-7 days after your period. Breasts are generally lumpier during  menstrual periods, and it may be more difficult to notice changes. With time and practice, you will become more familiar with the variations in your breasts and more comfortable with the exam. Contact a health care provider if you: See a change in the shape or size of your breasts or nipples. See a change in the skin of your breast or nipples, such as a reddened or scaly area. Have unusual discharge  from your nipples. Find a lump or thick area that was not there before. Have pain in your breasts. Have any concerns related to your breast health. Summary Breast self-awareness includes looking for physical changes in your breasts, as well as feeling for any changes within your breasts. Breast self-awareness should be performed in front of a mirror in a well-lit room. You should examine your breasts every month. If you menstruate, the best time to examine your breasts is 5-7 days after your menstrual period. Let your health care provider know of any changes you notice in your breasts, including changes in size, changes on the skin, pain or tenderness, or unusual fluid from your nipples. This information is not intended to replace advice given to you by your health care provider. Make sure you discuss any questions you have with your health care provider. Document Revised: 11/05/2017 Document Reviewed: 11/05/2017 Elsevier Patient Education  [place].

## 2021-01-04 NOTE — Progress Notes (Signed)
[facility] NOTE  01/04/2021  History of Present Illness: Annette Bruce is a 70 y.o. female well known to our service with a history of [place] following excisional biopsy with Dr Bary Castilla in 2019. She follows with our practice for annual mammograms. She has a Fhx of breast CA in her sister who passed away about 18 years ago. She continues to do slef breast examinations at home and reports no new findings. She denied any skin changes, nipple inversion, or drainage. She underwent mammogram on 01/02/2021 which was negative and  BiRADs 1.  No other issues   Past Medical History: Past Medical History:  Diagnosis Date   Allergic rhinitis    Atypical lobular hyperplasia (ALH) of breast 01/20/2018   Colon polyps    GERD (gastroesophageal reflux disease)    History of hiatal hernia    SMALL PER PT   Hyperlipemia    Hypertension    Postmenopausal bleeding    Rheumatoid arthritis ([place])    Dr Jefm Bryant   Vitamin D deficiency      Past Surgical History: Past Surgical History:  Procedure Laterality Date   APPENDECTOMY     BREAST BIOPSY Left 01/02/2018   x marker, LOQ, CYSTIC PAPILLARY APOCRINE METAPLASIA.    BREAST BIOPSY Left 01/02/2018   coil marker, UIQ, ATYPICAL LOBULAR HYPERPLASIA, CYSTIC PAPILLARY APOCRINE METAPLASIA   BREAST BIOPSY Left 01/20/2018   Procedure: BREAST BIOPSY;  Surgeon: Robert Bellow, MD;  Location: [facility] ORS;  Service: General;  Laterality: Left;   BREAST CYST ASPIRATION Left 1970's   negative   BREAST LUMPECTOMY Left 01/20/2018   Procedure: BREAST WIDE EXCISION;  Surgeon: Robert Bellow, MD;  Location: [facility] ORS;  Service: General;  Laterality: Left;   COLONOSCOPY WITH PROPOFOL N/A 12/16/2015   Procedure: COLONOSCOPY WITH PROPOFOL;  Surgeon: Lollie Sails, MD;  Location: [facility] ENDOSCOPY;  Service: Endoscopy;  Laterality: N/A;   ESOPHAGOGASTRODUODENOSCOPY (EGD) WITH PROPOFOL N/A 12/16/2015   Procedure:  ESOPHAGOGASTRODUODENOSCOPY (EGD) WITH PROPOFOL;  Surgeon: Lollie Sails, MD;  Location[facility] ENDOSCOPY;  Service: Endoscopy;  Laterality: N/A;   TONSILLECTOMY      Home Medications: Prior to Admission medications   Medication Sig Start Date End Date Taking? Authorizing Provider  Adalimumab 40 MG/0.8ML PNKT Inject into the skin every 14 (fourteen) days.   Yes [provider]  Calcium-Vitamin D-Vitamin K (CALCIUM FOR WOMEN) 175-102-58 MG-UNT-MCG CHEW Chew by mouth.   Yes [provider]  esomeprazole (NEXIUM) 20 MG capsule Take by mouth. 01/03/21  Yes [provider]  hydrochlorothiazide (HYDRODIURIL) 25 MG tablet Take 25 mg by mouth daily.   Yes [provider]  loratadine (CLARITIN) 10 MG tablet Take 10 mg by mouth daily.   Yes [provider]  mometasone (ELOCON) 0.1 % cream Apply 1 application topically every other day. 02/04/20  Yes Ralene Bathe, MD  Multiple Vitamin (MULTIVITAMIN ADULT PO) Take by mouth.   Yes [provider]  Probiotic Product (ALIGN PO) Take by mouth daily.   Yes [provider]  simvastatin (ZOCOR) 40 MG tablet Take 40 mg by mouth daily.   Yes [provider]    Allergies: Allergies  Allergen Reactions   Doxycycline Hives    Review of Systems: Review of Systems  Constitutional:  Negative for chills, fever and weight loss.  Respiratory:  Negative for cough and shortness of breath.   Cardiovascular:  Negative for chest pain and palpitations.  Gastrointestinal:  Negative for abdominal pain,  nausea and vomiting.  All other systems reviewed and are negative.  Physical Exam BP (!) 147/91   Pulse 64   Temp 98.1 F (36.7 C)   Ht 5\' 9"  (1.753 m)   Wt 269 lb 12.8 oz (122.4 kg)   SpO2 96%   BMI 39.84 kg/m   Physical Exam Vitals and nursing note reviewed. Exam conducted with a chaperone present.  Constitutional:      General: She is not in acute distress.    Appearance: Normal  appearance. She is obese. She is not ill-appearing.  HENT:     Head: Normocephalic and atraumatic.  Eyes:     Conjunctiva/sclera: Conjunctivae normal.     Pupils: Pupils are equal, round, and reactive to light.  Pulmonary:     Effort: Pulmonary effort is normal. No respiratory distress.  Chest:     Chest wall: No mass or deformity.  Breasts:    Breasts are symmetrical.     Right: Normal. No inverted nipple, mass or skin change.     Left: Normal. No inverted nipple, mass or skin change.     Comments: Chaperone breast; no appreciable masses or lumps to the bilateral breasts, no LAD, no skin changes, no nipple inversion Musculoskeletal:     Right lower leg: No edema.     Left lower leg: No edema.  Skin:    General: Skin is warm and dry.  Neurological:     General: No focal deficit present.     Mental Status: She is alert and oriented to person, place, and time.  Psychiatric:        Mood and Affect: Mood normal.        Behavior: Behavior normal.    Labs/Imaging:  Mammogram (01/02/2021) personally reviewed; now masses; and radiologist report reviewed:  IMPRESSION: No mammographic evidence of malignancy. A result letter of this screening mammogram will be mailed directly to the patient.   RECOMMENDATION: Screening mammogram in one year. (Code:SM-B-01Y)   BI-RADS CATEGORY  1: Negative.  Assessment and Plan: This is a 70 y.o. female presenting for annual breast screening examination   - No findings to warrant further intervention  - Continue self breast examinations  - Follow up in 1 year for annual mammogram and breast examination; encouraged to call sooner if any concerns   Face-to-face time spent with the patient and care providers was 20 minutes, with more than 50% of the time spent counseling, educating, and coordinating care of the patient.     Edison Simon, PA-C [facility] 01/04/2021, 1:28 PM [phone] M-F: 7am - 4pm

## 2021-02-08 ENCOUNTER — Ambulatory Visit: Payer: Medicare PPO | Admitting: Dermatology

## 2021-02-08 ENCOUNTER — Encounter: Payer: Self-pay | Admitting: Dermatology

## 2021-02-08 ENCOUNTER — Other Ambulatory Visit: Payer: Self-pay

## 2021-02-08 ENCOUNTER — Other Ambulatory Visit: Payer: Self-pay | Admitting: Dermatology

## 2021-02-08 DIAGNOSIS — Z1283 Encounter for screening for malignant neoplasm of skin: Secondary | ICD-10-CM

## 2021-02-08 DIAGNOSIS — L853 Xerosis cutis: Secondary | ICD-10-CM

## 2021-02-08 DIAGNOSIS — L409 Psoriasis, unspecified: Secondary | ICD-10-CM | POA: Diagnosis not present

## 2021-02-08 DIAGNOSIS — L719 Rosacea, unspecified: Secondary | ICD-10-CM | POA: Diagnosis not present

## 2021-02-08 DIAGNOSIS — D1800 Hemangioma unspecified site: Secondary | ICD-10-CM

## 2021-02-08 DIAGNOSIS — L820 Inflamed seborrheic keratosis: Secondary | ICD-10-CM

## 2021-02-08 DIAGNOSIS — L821 Other seborrheic keratosis: Secondary | ICD-10-CM

## 2021-02-08 DIAGNOSIS — L578 Other skin changes due to chronic exposure to nonionizing radiation: Secondary | ICD-10-CM | POA: Diagnosis not present

## 2021-02-08 DIAGNOSIS — L814 Other melanin hyperpigmentation: Secondary | ICD-10-CM

## 2021-02-08 DIAGNOSIS — D229 Melanocytic nevi, unspecified: Secondary | ICD-10-CM

## 2021-02-08 NOTE — Progress Notes (Signed)
Follow-Up Visit   Subjective  Annette Bruce is a 70 y.o. female who presents for the following: No chief complaint on file.. The patient presents for Total-Body Skin Exam (TBSE) for skin cancer screening and mole check. She complains of irritating spots on the back.  The following portions of the chart were reviewed this encounter and updated as appropriate:   Tobacco  Allergies  Meds  Problems  Med Hx  Surg Hx  Fam Hx     Review of Systems:  No other skin or systemic complaints except as noted in HPI or Assessment and Plan.  Objective  Well appearing patient in no apparent distress; mood and affect are within normal limits.  A full examination was performed including scalp, head, eyes, ears, nose, lips, neck, chest, axillae, abdomen, back, buttocks, bilateral upper extremities, bilateral lower extremities, hands, feet, fingers, toes, fingernails, and toenails. All findings within normal limits unless otherwise noted below.  Trunk, extremities Swelling of the L wrist, L hand, and L ankle. Psoriasis clear.  R low back x 7 (7) Erythematous keratotic or waxy stuck-on papule or plaque.   Face Mid face erythema with telangiectasias +/- scattered inflammatory papules.    Assessment & Plan  Psoriasis Trunk, extremities  With psoriatic arthritis, psoriasis inversa, and intertrigo - patient c/o more pain (joint aches and swelling) in the feet and hands R>L. Patient was unable to tolerate MTX. Psoriatic arthritis not at goal. Reviewed most recently labs which were WNL. Plan to order quantiferon gold today.   Psoriasis is a chronic non-curable, but treatable genetic/hereditary disease that may have other systemic features affecting other organ systems such as joints (Psoriatic Arthritis). It is associated with an increased risk of inflammatory bowel disease, heart disease, non-alcoholic fatty liver disease, and depression.    Consider switching Humira to [place] or Consentyx via  her rheumatologist since she is still having arthritis symptoms. Consider add on such as [place].   Cont Ketoconazole 2% cream qod to axilla until clear, then prn flares Cont Mometasone cr qod to axilla until clear, then prn flares Cont Humira as prescribed by Dr. Jefm Bryant  QuantiFERON-TB Gold Plus - Trunk, extremities  Related Medications mometasone (ELOCON) 0.1 % cream Apply 1 application topically every other day.  Inflamed seborrheic keratosis R low back x 7 Destruction of lesion - R low back x 7 Complexity: simple   Destruction method: cryotherapy   Informed consent: discussed and consent obtained   Timeout:  patient name, date of birth, surgical site, and procedure verified Lesion destroyed using liquid nitrogen: Yes   Region frozen until ice ball extended beyond lesion: Yes   Outcome: patient tolerated procedure well with no complications   Post-procedure details: wound care instructions given    Rosacea Face Rosacea is a chronic progressive skin condition usually affecting the face of adults, causing redness and/or acne bumps. It is treatable but not curable. It sometimes affects the eyes (ocular rosacea) as well. It may respond to topical and/or systemic medication and can flare with stress, sun exposure, alcohol, exercise and some foods.  Daily application of broad spectrum spf 30+ sunscreen to face is recommended to reduce flares. No treatment at this time.  Skin cancer screening  Lentigines - Scattered tan macules - Due to sun exposure - Benign-appearing, observe - Recommend daily broad spectrum sunscreen SPF 30+ to sun-exposed areas, reapply every 2 hours as needed. - Call for any changes  Seborrheic Keratoses - Stuck-on, waxy, tan-brown papules and/or plaques  -  Benign-appearing - Discussed benign etiology and prognosis. - Observe - Call for any changes  Melanocytic Nevi - Tan-brown and/or pink-flesh-colored symmetric macules and papules - Benign appearing  on exam today - Observation - Call clinic for new or changing moles - Recommend daily use of broad spectrum spf 30+ sunscreen to sun-exposed areas.   Hemangiomas - Red papules - Discussed benign nature - Observe - Call for any changes  Actinic Damage - Chronic condition, secondary to cumulative UV/sun exposure - diffuse scaly erythematous macules with underlying dyspigmentation - Recommend daily broad spectrum sunscreen SPF 30+ to sun-exposed areas, reapply every 2 hours as needed.  - Staying in the shade or wearing long sleeves, sun glasses (UVA+UVB protection) and wide brim hats (4-inch brim around the entire circumference of the hat) are also recommended for sun protection.  - Call for new or changing lesions.  Xerosis - diffuse xerotic patches - recommend gentle, hydrating skin care - gentle skin care handout given  Skin cancer screening performed today.  Return in about 1 year (around 02/08/2022) for TBSE; 3 mths psoriasis.  Luther Redo, CMA, am acting as scribe for [name], MD . Documentation: I have reviewed the above documentation for accuracy and completeness, and I agree with the above.  [name], MD

## 2021-02-08 NOTE — Patient Instructions (Addendum)
Gentle Skin Care Guide  1. Bathe no more than once a day.  2. Avoid bathing in hot water  3. Use a mild soap like Dove, Vanicream, Cetaphil, CeraVe. Can use Lever 2000 or Cetaphil antibacterial soap  4. Use soap only where you need it. On most days, use it under your arms, between your legs, and on your feet. Let the water rinse other areas unless visibly dirty.  5. When you get out of the bath/shower, use a towel to gently blot your skin dry, don't rub it.  6. While your skin is still a little damp, apply a moisturizing cream such as Vanicream, CeraVe, Cetaphil, Eucerin, Sarna lotion or plain Vaseline Jelly. For hands apply Neutrogena [place] Hand Cream or Excipial Hand Cream.  7. Reapply moisturizer any time you start to itch or feel dry.  8. Sometimes using free and clear laundry detergents can be helpful. Fabric softener sheets should be avoided. Downy Free & Gentle liquid, or any liquid fabric softener that is free of dyes and perfumes, it acceptable to use  9. If your doctor has given you prescription creams you may apply moisturizers over them      If you have any questions or concerns for your doctor, please call our main line at [phone] and press option 4 to reach your doctor's medical assistant. If no one answers, please leave a voicemail as directed and we will return your call as soon as possible. Messages left after 4 pm will be answered the following business day.   You may also send us a message via MyChart. We typically respond to MyChart messages within 1-2 business days.  For prescription refills, please ask your pharmacy to contact our office. Our fax number is [phone].  If you have an urgent issue when the clinic is closed that cannot wait until the next business day, you can page your doctor at the number below.    Please note that while we do our best to be available for urgent issues outside of office hours, we are not available 24/7.   If you have  an urgent issue and are unable to reach us, you may choose to seek medical care at your doctor's office, retail clinic, urgent care center, or emergency room.  If you have a medical emergency, please immediately call 911 or go to the emergency department.  Pager Numbers  - Dr. Kowalski: [phone]  - Dr. Moye: [phone]  - Dr. Stewart: [phone]  In the event of inclement weather, please call our main line at [phone] for an update on the status of any delays or closures.  Dermatology Medication Tips: Please keep the boxes that topical medications come in in order to help keep track of the instructions about where and how to use these. Pharmacies typically print the medication instructions only on the boxes and not directly on the medication tubes.   If your medication is too expensive, please contact our office at [phone] option 4 or send us a message through MyChart.   We are unable to tell what your co-pay for medications will be in advance as this is different depending on your insurance coverage. However, we may be able to find a substitute medication at lower cost or fill out paperwork to get insurance to cover a needed medication.   If a prior authorization is required to get your medication covered by your insurance company, please allow us 1-2 business days to complete this process.  Drug prices often vary   depending on where the prescription is filled and some pharmacies may offer cheaper prices.  The website www.goodrx.com contains coupons for medications through different pharmacies. The prices here do not account for what the cost may be with help from insurance (it may be cheaper with your insurance), but the website can give you the price if you did not use any insurance.  - You can print the associated coupon and take it with your prescription to the pharmacy.  - You may also stop by our office during regular business hours and pick up a GoodRx coupon card.   - If you need your prescription sent electronically to a different pharmacy, notify our office through [facility] MyChart or by phone at [phone] option 4.  

## 2021-02-17 LAB — QUANTIFERON-TB GOLD PLUS
QuantiFERON Mitogen Value: 8.89 IU/mL
QuantiFERON Nil Value: 0 IU/mL
QuantiFERON TB1 Ag Value: 0.04 IU/mL
QuantiFERON TB2 Ag Value: 0.02 IU/mL
QuantiFERON-TB Gold Plus: NEGATIVE

## 2021-02-20 ENCOUNTER — Telehealth: Payer: Self-pay

## 2021-02-20 NOTE — Telephone Encounter (Signed)
-----   Message from Ralene Bathe, MD sent at 02/18/2021  2:56 PM EST ----- TB test / Quantiferon Gold = Negative / normal May consider switching from Humira to [place] or Cosentyx due to Psoriatic Arthritis not well controlled. Ask pt if she will be seeing her Rheumatologist soon to help her make that decision (Dr Precious Reel? - may be retiring soon?) Or Does she want [place] to help her pursue this change?

## 2021-02-20 NOTE — Telephone Encounter (Signed)
Discussed lab results with patient. She is going to contact her rheumatologist's office and see what they recommend. She will contact our office with a decision after speaking with them.

## 2021-03-10 ENCOUNTER — Encounter: Payer: Self-pay | Admitting: Gastroenterology

## 2021-03-12 ENCOUNTER — Encounter: Payer: Self-pay | Admitting: Gastroenterology

## 2021-03-12 NOTE — H&P (Signed)
Pre-Procedure H&P     Patient ID: Annette Bruce is a 70 y.o. female.  Gastroenterology Provider: [name], DO  Referring Provider: Laurine Blazer, PA PCP: Baxter Hire, MD  Date: 03/13/2021  HPI Annette Bruce is a 70 y.o. female who presents today for Esophagogastroduodenoscopy and Colonoscopy for dysphagia, chronic reflux, personal history of colon polyps.  Undergoing colonoscopy for screening- last colonoscopy in 2017 noting diverticulosis. No polyps noted at that time (has had adenomatous polyps in the past). Tattoo in the descending colon. BM have been regular w/o blood.  Long standing h/o reflux- improved with protonix 40mg  qd, however is now on nexium 20mg  qd with good control. No abdominal pain. Occasional dysphagia to pills and minimally to solids. No liquid dysphagia or odynophagia. Infrequent regurgitation.  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 Tertiary contractions and small HH noted. Bx negative for Hpylori. Had irregular z line, however, negative for barretts esophagus. Only demonstrated reflux esophagitis. Hgb 13.6 mcv 92 plt 219  On humira for RA- changing to cosentyx  S/p hysterectomy.   Mother (33s) and MGM (75s) with CRC  Past Medical History:  Diagnosis Date   Allergic rhinitis    Atypical lobular hyperplasia (ALH) of breast 01/20/2018   Colon polyps    GERD (gastroesophageal reflux disease)    History of hiatal hernia    SMALL PER PT   Hyperlipemia    Hypertension    Postmenopausal bleeding    Rheumatoid arthritis ([place])    Dr Jefm Bryant   Vitamin D deficiency     Past Surgical History:  Procedure Laterality Date   APPENDECTOMY     BREAST BIOPSY Left 01/02/2018   x marker, LOQ, CYSTIC PAPILLARY APOCRINE METAPLASIA.    BREAST BIOPSY Left 01/02/2018   coil marker, UIQ, ATYPICAL LOBULAR HYPERPLASIA, CYSTIC PAPILLARY APOCRINE METAPLASIA   BREAST BIOPSY Left 01/20/2018    Procedure: BREAST BIOPSY;  Surgeon: Robert Bellow, MD;  Location: [facility] ORS;  Service: General;  Laterality: Left;   BREAST CYST ASPIRATION Left 1970's   negative   BREAST LUMPECTOMY Left 01/20/2018   Procedure: BREAST WIDE EXCISION;  Surgeon: Robert Bellow, MD;  Location: [facility] ORS;  Service: General;  Laterality: Left;   COLONOSCOPY WITH PROPOFOL N/A 12/16/2015   Procedure: COLONOSCOPY WITH PROPOFOL;  Surgeon: Lollie Sails, MD;  Location: [facility] ENDOSCOPY;  Service: Endoscopy;  Laterality: N/A;   ESOPHAGOGASTRODUODENOSCOPY (EGD) WITH PROPOFOL N/A 12/16/2015   Procedure: ESOPHAGOGASTRODUODENOSCOPY (EGD) WITH PROPOFOL;  Surgeon: Lollie Sails, MD;  Location: [facility] ENDOSCOPY;  Service: Endoscopy;  Laterality: N/A;   TONSILLECTOMY      Family History Mother (50s) and MGM (91s) with CRC No other h/o GI disease or malignancy  Review of Systems  Constitutional:  Negative for activity change, appetite change, chills, diaphoresis, fatigue, fever and unexpected weight change.  HENT:  Positive for trouble swallowing. Negative for voice change.   Respiratory:  Negative for shortness of breath and wheezing.   Cardiovascular:  Negative for chest pain, palpitations and leg swelling.  Gastrointestinal:  Negative for abdominal distention, abdominal pain, anal bleeding, blood in stool, constipation, diarrhea, nausea, rectal pain and vomiting.  Musculoskeletal:  Negative for arthralgias and myalgias.  Skin:  Negative for color change and pallor.  Neurological:  Negative for dizziness, syncope and weakness.  Psychiatric/Behavioral:  Negative for confusion.   All other systems reviewed and are negative.   Medications No current facility-administered medications on file prior to  encounter.   Current Outpatient Medications on File Prior to Encounter  Medication Sig Dispense Refill   Adalimumab 40 MG/0.8ML PNKT Inject into the skin every 14 (fourteen) days.     Calcium-Vitamin D-Vitamin K  (CALCIUM FOR WOMEN) 500-100-40 MG-UNT-MCG CHEW Chew by mouth.     esomeprazole (NEXIUM) 20 MG capsule Take by mouth.     hydrochlorothiazide (HYDRODIURIL) 25 MG tablet Take 25 mg by mouth daily.     loratadine (CLARITIN) 10 MG tablet Take 10 mg by mouth daily.     mometasone (ELOCON) 0.1 % cream Apply 1 application topically every other day. 45 g 6   Multiple Vitamin (MULTIVITAMIN ADULT PO) Take by mouth.     Probiotic Product (ALIGN PO) Take by mouth daily.     simvastatin (ZOCOR) 40 MG tablet Take 40 mg by mouth daily.      Pertinent medications related to GI and procedure were reviewed by me with the patient prior to the procedure   Current Facility-Administered Medications:    0.9 %  sodium chloride infusion, , Intravenous, Continuous, [name], DO, Last Rate: 20 mL/hr at 03/13/21 1116, 20 mL/hr at 03/13/21 1116      Allergies  Allergen Reactions   Doxycycline Hives   Allergies were reviewed by me prior to the procedure  Objective    Vitals:   03/13/21 1103  BP: (!) 150/79  Pulse: 79  Resp: 20  Temp: (!) 97.3 F (36.3 C)  TempSrc: Temporal  SpO2: 98%  Weight: 123.4 kg  Height: 5\' 9"  (1.753 m)     Physical Exam Vitals and nursing note reviewed.  Constitutional:      General: She is not in acute distress.    Appearance: Normal appearance. She is obese. She is not ill-appearing, toxic-appearing or diaphoretic.  HENT:     Head: Normocephalic and atraumatic.     Nose: Nose normal.     Mouth/Throat:     Mouth: Mucous membranes are moist.     Pharynx: Oropharynx is clear.  Eyes:     General: No scleral icterus.    Extraocular Movements: Extraocular movements intact.  Cardiovascular:     Rate and Rhythm: Normal rate and regular rhythm.     Heart sounds: Normal heart sounds. No murmur heard.   No friction rub. No gallop.  Pulmonary:     Effort: Pulmonary effort is normal. No respiratory distress.     Breath sounds: Normal breath sounds. No  wheezing, rhonchi or rales.  Abdominal:     General: Bowel sounds are normal. There is no distension.     Palpations: Abdomen is soft.     Tenderness: There is no abdominal tenderness. There is no guarding or rebound.  Musculoskeletal:     Cervical back: Neck supple.     Right lower leg: No edema.     Left lower leg: No edema.  Skin:    General: Skin is warm and dry.     Coloration: Skin is not jaundiced or pale.  Neurological:     General: No focal deficit present.     Mental Status: She is alert and oriented to person, place, and time. Mental status is at baseline.  Psychiatric:        Mood and Affect: Mood normal.        Behavior: Behavior normal.        Thought Content: Thought content normal.        Judgment: Judgment normal.     Assessment:  Annette Bruce is a 70 y.o. female  who presents today for Esophagogastroduodenoscopy and Colonoscopy for dysphagia, chronic reflux, personal history of colon polyps.  Plan:  Esophagogastroduodenoscopy and Colonoscopy with possible intervention today  Esophagogastroduodenoscopy and Colonoscopy with possible biopsy, control of bleeding, polypectomy, and interventions as necessary has been discussed with the patient/patient representative. Informed consent was obtained from the patient/patient representative after explaining the indication, nature, and risks of the procedure including but not limited to death, bleeding, perforation, missed neoplasm/lesions, cardiorespiratory compromise, and reaction to medications. Opportunity for questions was given and appropriate answers were provided. Patient/patient representative has verbalized understanding is amenable to undergoing the procedure.   [name], DO  [facility] Gastroenterology  Portions of the record may have been created with voice recognition software. Occasional wrong-word or 'sound-a-like' substitutions may have occurred due to the inherent limitations of  voice recognition software.  Read the chart carefully and recognize, using context, where substitutions may have occurred.

## 2021-03-13 ENCOUNTER — Ambulatory Visit: Payer: Medicare PPO | Admitting: Registered Nurse

## 2021-03-13 ENCOUNTER — Encounter: Payer: Self-pay | Admitting: Gastroenterology

## 2021-03-13 ENCOUNTER — Encounter
Admission: RE | Disposition: A | Discharge status: Home / Self Care | Payer: Self-pay | Source: Home / Self Care | Attending: Gastroenterology

## 2021-03-13 ENCOUNTER — Ambulatory Visit
Admission: RE | Admit: 2021-03-13 | Discharge: 2021-03-13 | Disposition: A | Discharge status: Home / Self Care | Payer: Medicare PPO | Attending: Gastroenterology | Admitting: Gastroenterology

## 2021-03-13 DIAGNOSIS — D123 Benign neoplasm of transverse colon: Secondary | ICD-10-CM | POA: Insufficient documentation

## 2021-03-13 DIAGNOSIS — D120 Benign neoplasm of cecum: Secondary | ICD-10-CM | POA: Diagnosis not present

## 2021-03-13 DIAGNOSIS — M069 Rheumatoid arthritis, unspecified: Secondary | ICD-10-CM | POA: Insufficient documentation

## 2021-03-13 DIAGNOSIS — K449 Diaphragmatic hernia without obstruction or gangrene: Secondary | ICD-10-CM | POA: Insufficient documentation

## 2021-03-13 DIAGNOSIS — K224 Dyskinesia of esophagus: Secondary | ICD-10-CM | POA: Diagnosis not present

## 2021-03-13 DIAGNOSIS — Z79899 Other long term (current) drug therapy: Secondary | ICD-10-CM | POA: Insufficient documentation

## 2021-03-13 DIAGNOSIS — K2100 Gastro-esophageal reflux disease with esophagitis, without bleeding: Secondary | ICD-10-CM | POA: Insufficient documentation

## 2021-03-13 DIAGNOSIS — K222 Esophageal obstruction: Secondary | ICD-10-CM | POA: Insufficient documentation

## 2021-03-13 DIAGNOSIS — R1310 Dysphagia, unspecified: Secondary | ICD-10-CM | POA: Diagnosis not present

## 2021-03-13 DIAGNOSIS — K225 Diverticulum of esophagus, acquired: Secondary | ICD-10-CM | POA: Diagnosis not present

## 2021-03-13 DIAGNOSIS — K2289 Other specified disease of esophagus: Secondary | ICD-10-CM | POA: Insufficient documentation

## 2021-03-13 DIAGNOSIS — K640 First degree hemorrhoids: Secondary | ICD-10-CM | POA: Insufficient documentation

## 2021-03-13 DIAGNOSIS — Z1211 Encounter for screening for malignant neoplasm of colon: Secondary | ICD-10-CM | POA: Insufficient documentation

## 2021-03-13 DIAGNOSIS — K317 Polyp of stomach and duodenum: Secondary | ICD-10-CM | POA: Insufficient documentation

## 2021-03-13 DIAGNOSIS — I10 Essential (primary) hypertension: Secondary | ICD-10-CM | POA: Insufficient documentation

## 2021-03-13 DIAGNOSIS — D125 Benign neoplasm of sigmoid colon: Secondary | ICD-10-CM | POA: Insufficient documentation

## 2021-03-13 HISTORY — PX: ESOPHAGOGASTRODUODENOSCOPY: SHX5428

## 2021-03-13 HISTORY — PX: COLONOSCOPY: SHX5424

## 2021-03-13 SURGERY — COLONOSCOPY
Anesthesia: General

## 2021-03-13 MED ORDER — DEXMEDETOMIDINE HCL 200 MCG/2ML IV SOLN
INTRAVENOUS | Status: DC | PRN
  Administered 2021-03-13: 20 ug via INTRAVENOUS

## 2021-03-13 MED ORDER — PROPOFOL 10 MG/ML IV BOLUS
INTRAVENOUS | Status: DC | PRN
  Administered 2021-03-13: 90 mg via INTRAVENOUS
  Administered 2021-03-13: 30 mg via INTRAVENOUS

## 2021-03-13 MED ORDER — SODIUM CHLORIDE 0.9 % IV SOLN
INTRAVENOUS | Status: DC
  Administered 2021-03-13: 20 mL/h via INTRAVENOUS

## 2021-03-13 MED ORDER — EPHEDRINE 5 MG/ML INJ
INTRAVENOUS | Status: AC
  Filled 2021-03-13: qty 5

## 2021-03-13 MED ORDER — PHENYLEPHRINE HCL (PRESSORS) 10 MG/ML IV SOLN
INTRAVENOUS | Status: DC | PRN
  Administered 2021-03-13 (×2): 100 ug via INTRAVENOUS
  Administered 2021-03-13: 200 ug via INTRAVENOUS

## 2021-03-13 MED ORDER — EPHEDRINE SULFATE 50 MG/ML IJ SOLN
INTRAMUSCULAR | Status: DC | PRN
  Administered 2021-03-13: 15 mg via INTRAVENOUS

## 2021-03-13 MED ORDER — PROPOFOL 500 MG/50ML IV EMUL
INTRAVENOUS | Status: DC | PRN
  Administered 2021-03-13: 150 ug/kg/min via INTRAVENOUS

## 2021-03-13 MED ORDER — LIDOCAINE HCL (CARDIAC) PF 100 MG/5ML IV SOSY
PREFILLED_SYRINGE | INTRAVENOUS | Status: DC | PRN
  Administered 2021-03-13: 100 mg via INTRAVENOUS

## 2021-03-13 NOTE — Op Note (Addendum)
[facility] Gastroenterology Patient Name: Annette Bruce Procedure Date: 03/13/2021 11:57 AM MRN: [id number] Account #: [id number] Date of Birth: May 19, 1950 Admit Type: Outpatient Age: 70 Room: [facility] ENDO ROOM 2 Gender: Female Note Status: Finalized Instrument Name: Colonscope [id number] Procedure:             Colonoscopy Indications:           Personal history of colonic polyps Providers:             [name] DO, DO Medicines:             Monitored Anesthesia Care Complications:         No immediate complications. Estimated blood loss:                         Minimal. Procedure:             Pre-Anesthesia Assessment:                        - Prior to the procedure, a History and Physical was                         performed, and patient medications and allergies were                         reviewed. The patient is competent. The risks and                         benefits of the procedure and the sedation options and                         risks were discussed with the patient. All questions                         were answered and informed consent was obtained.                         Patient identification and proposed procedure were                         verified by the physician, the nurse, the anesthetist                         and the technician in the endoscopy suite. Mental                         Status Examination: alert and oriented. Airway                         Examination: normal oropharyngeal airway and neck                         mobility. Respiratory Examination: clear to                         auscultation. CV Examination: RRR, no murmurs, no S3                         or S4. Prophylactic Antibiotics: The patient does not  require prophylactic antibiotics. Prior                         Anticoagulants: The patient has taken no previous                         anticoagulant or antiplatelet agents. ASA  Grade                         Assessment: III - A patient with severe systemic                         disease. After reviewing the risks and benefits, the                         patient was deemed in satisfactory condition to                         undergo the procedure. The anesthesia plan was to use                         monitored anesthesia care (MAC). Immediately prior to                         administration of medications, the patient was                         re-assessed for adequacy to receive sedatives. The                         heart rate, respiratory rate, oxygen saturations,                         blood pressure, adequacy of pulmonary ventilation, and                         response to care were monitored throughout the                         procedure. The physical status of the patient was                         re-assessed after the procedure.                        After obtaining informed consent, the colonoscope was                         passed under direct vision. Throughout the procedure,                         the patient's blood pressure, pulse, and oxygen                         saturations were monitored continuously. The                         Colonoscope was introduced through the anus and  advanced to the the terminal ileum, with                         identification of the appendiceal orifice and IC                         valve. The colonoscopy was performed without                         difficulty. The patient tolerated the procedure well.                         The quality of the bowel preparation was evaluated                         using the BBPS [facility] Bowel Preparation Scale) with                         scores of: Right Colon = 3, Transverse Colon = 3 and                         Left Colon = 3 (entire mucosa seen well with no                         residual staining, small fragments of stool or opaque                          liquid). The total BBPS score equals 9. The terminal                         ileum, ileocecal valve, appendiceal orifice, and                         rectum were photographed. Findings:      The perianal and digital rectal examinations were normal. Pertinent       negatives include normal sphincter tone.      The terminal ileum appeared normal.      Non-bleeding internal hemorrhoids were found during retroflexion. The       hemorrhoids were Grade I (internal hemorrhoids that do not prolapse).      A tattoo was seen in the descending colon. The tattoo site appeared       normal. Estimated blood loss: none.      Four sessile polyps were found in the sigmoid colon (3) and cecum (1).       The polyps were 1 to 3 mm in size. These polyps were removed with a cold       biopsy forceps. Resection and retrieval were complete. Estimated blood       loss was minimal.      Two sessile polyps were found in the transverse colon. The polyps were 3       to 5 mm in size. These polyps were removed with a cold snare. Resection       and retrieval were complete. Estimated blood loss was minimal.      The exam was otherwise without abnormality on direct and retroflexion       views. Impression:            -  The examined portion of the ileum was normal.                        - Non-bleeding internal hemorrhoids.                        - A tattoo was seen in the descending colon. The                         tattoo site appeared normal.                        - Four 1 to 3 mm polyps in the sigmoid colon (3) and                         in the cecum (1), removed with a cold biopsy forceps.                         Resected and retrieved.                        - Two 3 to 5 mm polyps in the transverse colon,                         removed with a cold snare. Resected and retrieved.                        - The examination was otherwise normal on direct and                         retroflexion  views. Recommendation:        - Discharge patient to home.                        - Resume previous diet.                        - Continue present medications.                        - No aspirin, ibuprofen, naproxen, or other                         non-steroidal anti-inflammatory drugs for 5 days after                         polyp removal.                        - Await pathology results.                        - Repeat colonoscopy for surveillance based on                         pathology results.                        - Return to GI office as previously scheduled. Procedure Code(s):     --- Professional ---  45385, Colonoscopy, flexible; with removal of                         tumor(s), polyp(s), or other lesion(s) by snare                         technique                        45380, 59, Colonoscopy, flexible; with biopsy, single                         or multiple Diagnosis Code(s):     --- Professional ---                        Z12.11, Encounter for screening for malignant neoplasm                         of colon                        K64.0, First degree hemorrhoids                        K63.5, Polyp of colon CPT copyright 2019 American Medical Association. All rights reserved. The codes documented in this report are preliminary and upon coder review may  be revised to meet current compliance requirements. Attending Participation:      I personally performed the entire procedure. Volney American, DO [name] DO, DO 03/13/2021 1:06:20 PM This report has been signed electronically. Number of Addenda: 0 Note Initiated On: 03/13/2021 11:57 AM Scope Withdrawal Time: 0 hours 17 minutes 5 seconds  Total Procedure Duration: 0 hours 30 minutes 34 seconds  Estimated Blood Loss:  Estimated blood loss was minimal.      [facility]

## 2021-03-13 NOTE — Transfer of Care (Signed)
Immediate Anesthesia Transfer of Care Note  Patient: LORYN HAACKE  Procedure(s) Performed: COLONOSCOPY ESOPHAGOGASTRODUODENOSCOPY (EGD)  Patient Location: Endoscopy Unit  Anesthesia Type:General  Level of Consciousness: drowsy  Airway & Oxygen Therapy: Patient Spontanous Breathing  Post-op Assessment: Report given to RN and Post -op Vital signs reviewed and stable  Post vital signs: Reviewed and stable  Last Vitals:  Vitals Value Taken Time  BP 84/56 03/13/21 1305  Temp    Pulse 59 03/13/21 1306  Resp 20 03/13/21 1306  SpO2 93 % 03/13/21 1306  Vitals shown include unvalidated device data.  Last Pain:  Vitals:   03/13/21 1103  TempSrc: Temporal  PainSc: 0-No pain         Complications: No notable events documented.

## 2021-03-13 NOTE — Op Note (Signed)
[facility] Gastroenterology Patient Name: Annette Bruce Procedure Date: 03/13/2021 11:57 AM MRN: [id number] Account #: [id number] Date of Birth: Mar 17, 1951 Admit Type: Outpatient Age: 70 Room: [facility] ENDO ROOM 2 Gender: Female Note Status: Finalized Instrument Name: Upper Endoscope [id number] Procedure:             Upper GI endoscopy Indications:           Dysphagia Providers:             [name] DO, DO Medicines:             Monitored Anesthesia Care Complications:         No immediate complications. Estimated blood loss:                         Minimal. Procedure:             Pre-Anesthesia Assessment:                        - Prior to the procedure, a History and Physical was                         performed, and patient medications and allergies were                         reviewed. The patient is competent. The risks and                         benefits of the procedure and the sedation options and                         risks were discussed with the patient. All questions                         were answered and informed consent was obtained.                         Patient identification and proposed procedure were                         verified by the physician, the nurse, the anesthetist                         and the technician in the endoscopy suite. Mental                         Status Examination: alert and oriented. Airway                         Examination: normal oropharyngeal airway and neck                         mobility. Respiratory Examination: clear to                         auscultation. CV Examination: RRR, no murmurs, no S3                         or S4. Prophylactic Antibiotics: The patient does not  require prophylactic antibiotics. Prior                         Anticoagulants: The patient has taken no previous                         anticoagulant or antiplatelet agents. ASA Grade                          Assessment: III - A patient with severe systemic                         disease. After reviewing the risks and benefits, the                         patient was deemed in satisfactory condition to                         undergo the procedure. The anesthesia plan was to use                         monitored anesthesia care (MAC). Immediately prior to                         administration of medications, the patient was                         re-assessed for adequacy to receive sedatives. The                         heart rate, respiratory rate, oxygen saturations,                         blood pressure, adequacy of pulmonary ventilation, and                         response to care were monitored throughout the                         procedure. The physical status of the patient was                         re-assessed after the procedure.                        After obtaining informed consent, the endoscope was                         passed under direct vision. Throughout the procedure,                         the patient's blood pressure, pulse, and oxygen                         saturations were monitored continuously. The Endoscope                         was introduced through the mouth, and advanced to the  second part of duodenum. The upper GI endoscopy was                         accomplished without difficulty. The patient tolerated                         the procedure well. Findings:      The duodenal bulb, first portion of the duodenum and second portion of       the duodenum were normal. Estimated blood loss: none.      Multiple 1 to 8 mm sessile polyps with no bleeding and no stigmata of       recent bleeding were found on the greater curvature of the stomach.       Estimated blood loss: none.      A single 2 to 3 mm sessile polyp with no bleeding and stigmata of recent       bleeding was found at the incisura. Biopsies were taken with  a cold       forceps for histology. Estimated blood loss was minimal.      A 2 cm hiatal hernia was present. Estimated blood loss: none.      The Z-line was regular. Estimated blood loss: none.      Esophagogastric landmarks were identified: the gastroesophageal junction       was found at 38 cm from the incisors.      A widely patent Schatzki ring was found in the distal esophagus.      Abnormal motility was noted in the esophagus. There is spasticity of the       esophageal body. The distal esophagus/lower esophageal sphincter is       open. Tertiary peristaltic waves are noted. Estimated blood loss: none.      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 Estimated blood loss:       none.      The exam was otherwise without abnormality. Impression:            - Normal duodenal bulb, first portion of the duodenum                         and second portion of the duodenum.                        - Multiple gastric polyps.                        - A single gastric polyp. Biopsied.                        - 2 cm hiatal hernia.                        - Z-line regular.                        - Esophagogastric landmarks identified.                        - Widely patent Schatzki ring.                        -  Abnormal esophageal motility, consistent with                         presbyesophagus.                        - Zenker's diverticulum.                        - The examination was otherwise normal. Recommendation:        - Discharge patient to home.                        - Resume previous diet.                        - Continue present medications.                        - Await pathology results.                        - Return to GI clinic as previously scheduled. Procedure Code(s):     --- Professional ---                        [phone], Esophagogastroduodenoscopy, flexible,                          transoral; with biopsy, single or multiple Diagnosis Code(s):     --- Professional ---                        K31.7, Polyp of stomach and duodenum                        K44.9, Diaphragmatic hernia without obstruction or                         gangrene                        K22.2, Esophageal obstruction                        K22.4, Dyskinesia of esophagus                        K22.5, Diverticulum of esophagus, acquired                        R13.10, Dysphagia, unspecified CPT copyright 2019 American Medical Association. All rights reserved. The codes documented in this report are preliminary and upon coder review may  be revised to meet current compliance requirements. Attending Participation:      I personally performed the entire procedure. Volney American, DO [name] DO, DO 03/13/2021 12:25:04 PM This report has been signed electronically. Number of Addenda: 0 Note Initiated On: 03/13/2021 11:57 AM Estimated Blood Loss:  Estimated blood loss was minimal.      [facility]

## 2021-03-13 NOTE — Interval H&P Note (Signed)
History and Physical Interval Note: Preprocedure H&P from 03/13/21  was reviewed and there was no interval change after seeing and examining the patient.  Written consent was obtained from the patient after discussion of risks, benefits, and alternatives. Patient has consented to proceed with Esophagogastroduodenoscopy and Colonoscopy with possible intervention   03/13/2021 12:04 PM  Annette Bruce  has presented today for surgery, with the diagnosis of Dysphagia, unspecified type (R13.10) History of colon polyps (Z86.010) Gastroesophageal reflux disease, unspecified whether esophagitis present (K21.9).  The various methods of treatment have been discussed with the patient and family. After consideration of risks, benefits and other options for treatment, the patient has consented to  Procedure(s): COLONOSCOPY (N/A) ESOPHAGOGASTRODUODENOSCOPY (EGD) (N/A) as a surgical intervention.  The patient's history has been reviewed, patient examined, no change in status, stable for surgery.  I have reviewed the patient's chart and labs.  Questions were answered to the patient's satisfaction.     [name]

## 2021-03-13 NOTE — Anesthesia Preprocedure Evaluation (Signed)
Anesthesia Evaluation  Patient identified by MRN, date of birth, ID band Patient awake    Reviewed: Allergy & Precautions, H&P , NPO status , Patient's Chart, lab work & pertinent test results, reviewed documented beta blocker date and time   Airway Mallampati: II   Neck ROM: full    Dental  (+) Poor Dentition   Pulmonary neg pulmonary ROS,    Pulmonary exam normal        Cardiovascular Exercise Tolerance: Good hypertension, On Medications negative cardio ROS Normal cardiovascular exam Rhythm:regular Rate:Normal     Neuro/Psych negative neurological ROS  negative psych ROS   GI/Hepatic Neg liver ROS, hiatal hernia, GERD  Medicated,  Endo/Other  negative endocrine ROS  Renal/GU negative Renal ROS  negative genitourinary   Musculoskeletal   Abdominal   Peds  Hematology negative hematology ROS (+)   Anesthesia Other Findings Past Medical History: No date: Allergic rhinitis 01/20/2018: Atypical lobular hyperplasia (ALH) of breast No date: Colon polyps No date: GERD (gastroesophageal reflux disease) No date: History of hiatal hernia     Comment:  SMALL PER PT No date: Hyperlipemia No date: Hypertension No date: Postmenopausal bleeding No date: Rheumatoid arthritis ([place])     Comment:  Dr Jefm Bryant No date: Vitamin D deficiency Past Surgical History: No date: APPENDECTOMY 01/02/2018: BREAST BIOPSY; Left     Comment:  x marker, LOQ, CYSTIC PAPILLARY APOCRINE METAPLASIA.  01/02/2018: BREAST BIOPSY; Left     Comment:  coil marker, UIQ, ATYPICAL LOBULAR HYPERPLASIA, CYSTIC               PAPILLARY APOCRINE METAPLASIA 01/20/2018: BREAST BIOPSY; Left     Comment:  Procedure: BREAST BIOPSY;  Surgeon: Robert Bellow,               MD;  Location: [facility] ORS;  Service: General;  Laterality:               Left; 1970's: BREAST CYST ASPIRATION; Left     Comment:  negative 01/20/2018: BREAST LUMPECTOMY; Left      Comment:  Procedure: BREAST WIDE EXCISION;  Surgeon: Robert Bellow, MD;  Location: [facility] ORS;  Service: General;                Laterality: Left; 12/16/2015: COLONOSCOPY WITH PROPOFOL; N/A     Comment:  Procedure: COLONOSCOPY WITH PROPOFOL;  Surgeon: Lollie Sails, MD;  Location: [facility] ENDOSCOPY;  Service:               Endoscopy;  Laterality: N/A; 12/16/2015: ESOPHAGOGASTRODUODENOSCOPY (EGD) WITH PROPOFOL; N/A     Comment:  Procedure: ESOPHAGOGASTRODUODENOSCOPY (EGD) WITH               PROPOFOL;  Surgeon: Lollie Sails, MD;  Location:               [facility] ENDOSCOPY;  Service: Endoscopy;  Laterality: N/A; No date: TONSILLECTOMY BMI    Body Mass Index: 40.17 kg/m     Reproductive/Obstetrics negative OB ROS                             Anesthesia Physical Anesthesia Plan  ASA: 2  Anesthesia Plan: General   Post-op Pain Management:    Induction:   PONV Risk Score and Plan:   Airway  Management Planned:   Additional Equipment:   Intra-op Plan:   Post-operative Plan:   Informed Consent: I have reviewed the patients History and Physical, chart, labs and discussed the procedure including the risks, benefits and alternatives for the proposed anesthesia with the patient or authorized representative who has indicated his/her understanding and acceptance.     Dental Advisory Given  Plan Discussed with: CRNA  Anesthesia Plan Comments:         Anesthesia Quick Evaluation

## 2021-03-14 ENCOUNTER — Encounter: Payer: Self-pay | Admitting: Gastroenterology

## 2021-03-14 ENCOUNTER — Telehealth: Payer: Self-pay

## 2021-03-14 LAB — SURGICAL PATHOLOGY

## 2021-03-14 NOTE — Telephone Encounter (Signed)
Patient called to let you know that her Rheumatologist has prescribed the Cosentyx and will be getting patient started on therapy.

## 2021-03-16 NOTE — Anesthesia Postprocedure Evaluation (Signed)
Anesthesia Post Note  Patient: Annette Bruce  Procedure(s) Performed: COLONOSCOPY ESOPHAGOGASTRODUODENOSCOPY (EGD)  Patient location during evaluation: PACU Anesthesia Type: General Level of consciousness: awake and alert Pain management: pain level controlled Vital Signs Assessment: post-procedure vital signs reviewed and stable Respiratory status: spontaneous breathing, nonlabored ventilation, respiratory function stable and patient connected to nasal cannula oxygen Cardiovascular status: blood pressure returned to baseline and stable Postop Assessment: no apparent nausea or vomiting Anesthetic complications: no   No notable events documented.   Last Vitals:  Vitals:   03/13/21 1322 03/13/21 1332  BP: 115/68 119/75  Pulse: 78 68  Resp: 15 16  Temp:    SpO2: 97% 97%    Last Pain:  Vitals:   03/14/21 0741  TempSrc:   PainSc: 0-No pain                 Molli Barrows

## 2021-05-17 ENCOUNTER — Ambulatory Visit: Payer: Self-pay | Admitting: Dermatology

## 2021-06-14 ENCOUNTER — Ambulatory Visit: Payer: Medicare PPO | Admitting: Dermatology

## 2021-06-14 ENCOUNTER — Other Ambulatory Visit: Payer: Self-pay

## 2021-06-14 DIAGNOSIS — Z79899 Other long term (current) drug therapy: Secondary | ICD-10-CM | POA: Diagnosis not present

## 2021-06-14 DIAGNOSIS — L304 Erythema intertrigo: Secondary | ICD-10-CM

## 2021-06-14 DIAGNOSIS — L409 Psoriasis, unspecified: Secondary | ICD-10-CM

## 2021-06-14 DIAGNOSIS — L4050 Arthropathic psoriasis, unspecified: Secondary | ICD-10-CM

## 2021-06-14 DIAGNOSIS — L578 Other skin changes due to chronic exposure to nonionizing radiation: Secondary | ICD-10-CM

## 2021-06-14 DIAGNOSIS — L821 Other seborrheic keratosis: Secondary | ICD-10-CM

## 2021-06-14 NOTE — Patient Instructions (Signed)

## 2021-06-14 NOTE — Progress Notes (Signed)
? ?Follow-Up Visit ?  ?Subjective  ?Annette Bruce is a 71 y.o. female who presents for the following: psoriasis with psoriatic arthritis and intertrigo  (Patient started Cosentyx injections about 7 weeks ago but she did have Covid in the interim and it took time. She states her skin has improved but still c/o pain of the L foot, ankle, hip, and between the shoulder blades. ). ?The patient has spots, moles and lesions to be evaluated, some may be new or changing and the patient has concerns that these could be cancer. ? ?The following portions of the chart were reviewed this encounter and updated as appropriate:  ? Tobacco  Allergies  Meds  Problems  Med Hx  Surg Hx  Fam Hx   ?  ?Review of Systems:  No other skin or systemic complaints except as noted in HPI or Assessment and Plan. ? ?Objective  ?Well appearing patient in no apparent distress; mood and affect are within normal limits. ? ?A focused examination was performed including the trunk and extremities. Relevant physical exam findings are noted in the Assessment and Plan. ? ?Trunk, extremities ?Guttate plaques on the trunk and extremities. Low back spinal with plaque.  ? ? ?Assessment & Plan  ?Psoriasis ?Trunk, extremities ? ?With psoriasis inversa, PsA, and intertrigo -  patient c/o more pain (joint aches and swelling) in L foot, ankle, and hip even since starting Cosentyx seven weeks ago. Patient was unable to tolerate MTX. Psoriatic arthritis not at goal. Reviewed most recently labs which were WNL. ? ?Psoriasis is a chronic non-curable, but treatable genetic/hereditary disease that may have other systemic features affecting other organ systems such as joints (Psoriatic Arthritis). It is associated with an increased risk of inflammatory bowel disease, heart disease, non-alcoholic fatty liver disease, and depression.   ? ?Continue Cosentyx '300mg'$  SQ QM. Patient has noticed an improvement in skin but still c/o joint pain and aches. Patient should try  Cosentyx for at least six months to see full benefit of drug. Reviewed risks of biologics including immunosuppression, infections, injection site reaction, and failure to improve condition. Goal is control of skin condition, not cure.  Some older biologics such as Humira and Enbrel may slightly increase risk of malignancy and may worsen congestive heart failure. The use of biologics requires long term medication management, including periodic office visits and monitoring of blood work. ? ? ?Cont Ketoconazole 2% cream qod to axilla until clear, then prn flares ?Cont Mometasone cr qod to axilla until clear, then prn flares ? ?Continue care with [facility] Rheumatology patient to schedule with their office by six months since they initiated Cosentyx treatment and still c/o pain.   ? ?Related Medications ?mometasone (ELOCON) 0.1 % cream ?Apply 1 application topically every other day. ? ?Seborrheic Keratoses ?- Stuck-on, waxy, tan-brown papules and/or plaques  ?- Benign-appearing ?- Discussed benign etiology and prognosis. ?- Observe ?- Call for any changes ? ?Actinic Damage ?- chronic, secondary to cumulative UV radiation exposure/sun exposure over time ?- diffuse scaly erythematous macules with underlying dyspigmentation ?- Recommend daily broad spectrum sunscreen SPF 30+ to sun-exposed areas, reapply every 2 hours as needed.  ?- Recommend staying in the shade or wearing long sleeves, sun glasses (UVA+UVB protection) and wide brim hats (4-inch brim around the entire circumference of the hat). ?- Call for new or changing lesions. ? ?Return in about 6 months (around 12/15/2021) for psoriasis follow up . ? ?IRudell Cobb, CMA, am acting as scribe for [name], MD . ?  Documentation: I have reviewed the above documentation for accuracy and completeness, and I agree with the above. ? ?[name], MD ? ?

## 2021-06-20 ENCOUNTER — Encounter: Payer: Self-pay | Admitting: Dermatology

## 2021-11-23 ENCOUNTER — Other Ambulatory Visit: Payer: Self-pay

## 2021-11-23 DIAGNOSIS — Z1231 Encounter for screening mammogram for malignant neoplasm of breast: Secondary | ICD-10-CM

## 2021-12-15 ENCOUNTER — Other Ambulatory Visit: Payer: Self-pay | Admitting: Dermatology

## 2021-12-15 DIAGNOSIS — L304 Erythema intertrigo: Secondary | ICD-10-CM

## 2021-12-15 DIAGNOSIS — L409 Psoriasis, unspecified: Secondary | ICD-10-CM

## 2021-12-28 ENCOUNTER — Ambulatory Visit
Admission: RE | Admit: 2021-12-28 | Discharge: 2021-12-28 | Disposition: A | Discharge status: Home / Self Care | Payer: Medicare PPO | Source: Ambulatory Visit | Attending: Surgery | Admitting: Surgery

## 2021-12-28 DIAGNOSIS — Z1231 Encounter for screening mammogram for malignant neoplasm of breast: Secondary | ICD-10-CM | POA: Diagnosis not present

## 2022-01-01 ENCOUNTER — Ambulatory Visit: Payer: Medicare PPO | Admitting: Dermatology

## 2022-01-01 DIAGNOSIS — L408 Other psoriasis: Secondary | ICD-10-CM

## 2022-01-01 DIAGNOSIS — L304 Erythema intertrigo: Secondary | ICD-10-CM | POA: Diagnosis not present

## 2022-01-01 DIAGNOSIS — L4050 Arthropathic psoriasis, unspecified: Secondary | ICD-10-CM | POA: Diagnosis not present

## 2022-01-01 DIAGNOSIS — L409 Psoriasis, unspecified: Secondary | ICD-10-CM

## 2022-01-01 DIAGNOSIS — D224 Melanocytic nevi of scalp and neck: Secondary | ICD-10-CM

## 2022-01-01 DIAGNOSIS — Z79899 Other long term (current) drug therapy: Secondary | ICD-10-CM

## 2022-01-01 DIAGNOSIS — D229 Melanocytic nevi, unspecified: Secondary | ICD-10-CM

## 2022-01-01 NOTE — Patient Instructions (Signed)
Due to recent changes in healthcare laws, you may see results of your pathology and/or laboratory studies on MyChart before the doctors have had a chance to review them. We understand that in some cases there may be results that are confusing or concerning to you. Please understand that not all results are received at the same time and often the doctors may need to interpret multiple results in order to provide you with the best plan of care or course of treatment. Therefore, we ask that you please give us 2 business days to thoroughly review all your results before contacting the office for clarification. Should we see a critical lab result, you will be contacted sooner.   If You Need Anything After Your Visit  If you have any questions or concerns for your doctor, please call our main line at [phone] and press option 4 to reach your doctor's medical assistant. If no one answers, please leave a voicemail as directed and we will return your call as soon as possible. Messages left after 4 pm will be answered the following business day.   You may also send us a message via MyChart. We typically respond to MyChart messages within 1-2 business days.  For prescription refills, please ask your pharmacy to contact our office. Our fax number is [phone].  If you have an urgent issue when the clinic is closed that cannot wait until the next business day, you can page your doctor at the number below.    Please note that while we do our best to be available for urgent issues outside of office hours, we are not available 24/7.   If you have an urgent issue and are unable to reach us, you may choose to seek medical care at your doctor's office, retail clinic, urgent care center, or emergency room.  If you have a medical emergency, please immediately call 911 or go to the emergency department.  Pager Numbers  - Dr. Kowalski: [phone]  - Dr. Moye: [phone]  - Dr. Stewart:  [phone]  In the event of inclement weather, please call our main line at [phone] for an update on the status of any delays or closures.  Dermatology Medication Tips: Please keep the boxes that topical medications come in in order to help keep track of the instructions about where and how to use these. Pharmacies typically print the medication instructions only on the boxes and not directly on the medication tubes.   If your medication is too expensive, please contact our office at [phone] option 4 or send us a message through MyChart.   We are unable to tell what your co-pay for medications will be in advance as this is different depending on your insurance coverage. However, we may be able to find a substitute medication at lower cost or fill out paperwork to get insurance to cover a needed medication.   If a prior authorization is required to get your medication covered by your insurance company, please allow us 1-2 business days to complete this process.  Drug prices often vary depending on where the prescription is filled and some pharmacies may offer cheaper prices.  The website www.goodrx.com contains coupons for medications through different pharmacies. The prices here do not account for what the cost may be with help from insurance (it may be cheaper with your insurance), but the website can give you the price if you did not use any insurance.  - You can print the associated coupon and take it with   your prescription to the pharmacy.  - You may also stop by our office during regular business hours and pick up a GoodRx coupon card.  - If you need your prescription sent electronically to a different pharmacy, notify our office through [facility] MyChart or by phone at [phone] option 4.     Si Usted Necesita Algo Despus de Su Visita  Tambin puede enviarnos un mensaje a travs de MyChart. Por lo general respondemos a los mensajes de MyChart en el transcurso de 1 a 2  das hbiles.  Para renovar recetas, por favor pida a su farmacia que se ponga en contacto con nuestra oficina. Nuestro nmero de fax es el [phone].  Si tiene un asunto urgente cuando la clnica est cerrada y que no puede esperar hasta el siguiente da hbil, puede llamar/localizar a su doctor(a) al nmero que aparece a continuacin.   Por favor, tenga en cuenta que aunque hacemos todo lo posible para estar disponibles para asuntos urgentes fuera del horario de oficina, no estamos disponibles las 24 horas del da, los 7 das de la semana.   Si tiene un problema urgente y no puede comunicarse con nosotros, puede optar por buscar atencin mdica  en el consultorio de su doctor(a), en una clnica privada, en un centro de atencin urgente o en una sala de emergencias.  Si tiene una emergencia mdica, por favor llame inmediatamente al 911 o vaya a la sala de emergencias.  Nmeros de bper  - Dr. Kowalski: [phone]  - Dra. Moye: [phone]  - Dra. Stewart: [phone]  En caso de inclemencias del tiempo, por favor llame a nuestra lnea principal al [phone] para una actualizacin sobre el estado de cualquier retraso o cierre.  Consejos para la medicacin en dermatologa: Por favor, guarde las cajas en las que vienen los medicamentos de uso tpico para ayudarle a seguir las instrucciones sobre dnde y cmo usarlos. Las farmacias generalmente imprimen las instrucciones del medicamento slo en las cajas y no directamente en los tubos del medicamento.   Si su medicamento es muy caro, por favor, pngase en contacto con nuestra oficina llamando al [phone] y presione la opcin 4 o envenos un mensaje a travs de MyChart.   No podemos decirle cul ser su copago por los medicamentos por adelantado ya que esto es diferente dependiendo de la cobertura de su seguro. Sin embargo, es posible que podamos encontrar un medicamento sustituto a menor costo o llenar un formulario para que el  seguro cubra el medicamento que se considera necesario.   Si se requiere una autorizacin previa para que su compaa de seguros cubra su medicamento, por favor permtanos de 1 a 2 das hbiles para completar este proceso.  Los precios de los medicamentos varan con frecuencia dependiendo del lugar de dnde se surte la receta y alguna farmacias pueden ofrecer precios ms baratos.  El sitio web www.goodrx.com tiene cupones para medicamentos de diferentes farmacias. Los precios aqu no tienen en cuenta lo que podra costar con la ayuda del seguro (puede ser ms barato con su seguro), pero el sitio web puede darle el precio si no utiliz ningn seguro.  - Puede imprimir el cupn correspondiente y llevarlo con su receta a la farmacia.  - Tambin puede pasar por nuestra oficina durante el horario de atencin regular y recoger una tarjeta de cupones de GoodRx.  - Si necesita que su receta se enve electrnicamente a una farmacia diferente, informe a nuestra oficina a travs de MyChart de [facility]   o por telfono llamando al [phone] y presione la opcin 4.  

## 2022-01-01 NOTE — Progress Notes (Signed)
   Follow-Up Visit   Subjective  Annette Bruce is a 71 y.o. female who presents for the following: Psoriasis (6 month follow up - rheumatologist switched from Consentyx to Rinvoq 8 weeks ago. Her arthritis is much better. She has one place in left groin that has not cleared up./) and Other (Spot on scalp that sje would like checked).  The following portions of the chart were reviewed this encounter and updated as appropriate:   Tobacco  Allergies  Meds  Problems  Med Hx  Surg Hx  Fam Hx     Review of Systems:  No other skin or systemic complaints except as noted in HPI or Assessment and Plan.  Objective  Well appearing patient in no apparent distress; mood and affect are within normal limits.  A focused examination was performed including scalp, arms, left groin. Relevant physical exam findings are noted in the Assessment and Plan.  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 It is associated with an increased risk of inflammatory bowel disease, heart disease, non-alcoholic fatty liver disease, and depression.    Continue Rinvoq as prescribed by rheumatologist Joints much better on Rinvoq.  Skin doing well also.  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  It is exacerbated by increased movement / activity; sweating; and higher atmospheric temperature.  Continue  mometasone cream qod alternating with Ketoconazole 2% cream qod when she has active areas  Related Medications ketoconazole (NIZORAL) 2 % cream APPLY TOPICALLY TO THE AFFECTED AREA EVERY OTHER DAY  Nevus Left scalp crown Flesh-colored papule 0.6 x 0.4 cm. Benign-appearing.  Observation.  Call clinic for new or changing lesions.  Recommend daily use of broad spectrum spf 30+ sunscreen to sun-exposed areas.    Return in about 6 months (around 07/03/2022).  I, Ashok Cordia, CMA, am acting as scribe for [name], MD . Documentation: I have reviewed the above documentation for accuracy and completeness, and I agree with the above.  [name], MD

## 2022-01-02 ENCOUNTER — Encounter: Payer: Self-pay | Admitting: Dermatology

## 2022-01-08 ENCOUNTER — Ambulatory Visit: Payer: Medicare PPO | Admitting: Surgery

## 2022-01-08 ENCOUNTER — Encounter: Payer: Self-pay | Admitting: Surgery

## 2022-01-08 VITALS — BP 122/84 | HR 71 | Temp 98.0°F | Ht 69.0 in | Wt 270.0 lb

## 2022-01-08 DIAGNOSIS — Z803 Family history of malignant neoplasm of breast: Secondary | ICD-10-CM

## 2022-01-08 DIAGNOSIS — N6092 Unspecified benign mammary dysplasia of left breast: Secondary | ICD-10-CM

## 2022-01-08 DIAGNOSIS — Z1239 Encounter for other screening for malignant neoplasm of breast: Secondary | ICD-10-CM

## 2022-01-08 NOTE — Patient Instructions (Addendum)
Patient will be asked to return to the office in one year with a bilateral screening mammogram.  We will send you a letter about these appointments.   Continue self breast exams. Call office for any new breast issues or concerns.   Breast Self-Awareness Breast self-awareness is knowing how your breasts look and feel. You need to: Check your breasts on a regular basis. Tell your doctor about any changes. Become familiar with the look and feel of your breasts. This can help you catch a breast problem while it is still small and can be treated. You should do breast self-exams even if you have breast implants. What you need: A mirror. A well-lit room. A pillow or other soft object. How to do a breast self-exam Follow these steps to do a breast self-exam: Look for changes  Take off all the clothes above your waist. Stand in front of a mirror in a room with good lighting. Put your hands down at your sides. Compare your breasts in the mirror. Look for any difference between them, such as: A difference in shape. A difference in size. Wrinkles, dips, and bumps in one breast and not the other. Look at each breast for changes in the skin, such as: Redness. Scaly areas. Skin that has gotten thicker. Dimpling. Open sores (ulcers). Look for changes in your nipples, such as: Fluid coming out of a nipple. Fluid around a nipple. Bleeding. Dimpling. Redness. A nipple that looks pushed in (retracted), or that has changed position. Feel for changes Lie on your back. Feel each breast. To do this: Pick a breast to feel. Place a pillow under the shoulder closest to that breast. Put the arm closest to that breast behind your head. Feel the nipple area of that breast using the hand of your other arm. Feel the area with the pads of your three middle fingers by making small circles with your fingers. Use light, medium, and firm pressure. Continue the overlapping circles, moving downward over the  breast. Keep making circles with your fingers. Stop when you feel your ribs. Start making circles with your fingers again, this time going upward until you reach your collarbone. Then, make circles outward across your breast and into your armpit area. Squeeze your nipple. Check for discharge and lumps. Repeat these steps to check your other breast. Sit or stand in the tub or shower. With soapy water on your skin, feel each breast the same way you did when you were lying down. Write down what you find Writing down what you find can help you remember what to tell your doctor. Write down: What is normal for each breast. Any changes you find in each breast. These include: The kind of changes you find. A tender or painful breast. Any lump you find. Write down its size and where it is. When you last had your monthly period (menstrual cycle). General tips If you are breastfeeding, the best time to check your breasts is after you feed your baby or after you use a breast pump. If you get monthly bleeding, the best time to check your breasts is 5-7 days after your monthly cycle ends. With time, you will become comfortable with the self-exam. You will also start to know if there are changes in your breasts. Contact a doctor if: You see a change in the shape or size of your breasts or nipples. You see a change in the skin of your breast or nipples, such as red or scaly skin. You  have fluid coming from your nipples that is not normal. You find a new lump or thick area. You have breast pain. You have any concerns about your breast health. Summary Breast self-awareness includes looking for changes in your breasts and feeling for changes within your breasts. You should do breast self-awareness in front of a mirror in a well-lit room. If you get monthly periods (menstrual cycles), the best time to check your breasts is 5-7 days after your period ends. Tell your doctor about any changes you see in your  breasts. Changes include changes in size, changes on the skin, painful or tender breasts, or fluid from your nipples that is not normal. This information is not intended to replace advice given to you by your health care provider. Make sure you discuss any questions you have with your health care provider. Document Revised: 01/19/2021 Document Reviewed: 01/19/2021 Elsevier Patient Education  [place].

## 2022-01-10 NOTE — Progress Notes (Signed)
Outpatient Surgical Follow Up  01/10/2022  Annette Bruce is an 71 y.o. female.   Chief Complaint  Patient presents with   Follow-up    HPI: Annette Bruce is a 71 yo female with hx of [place] after excisional biopsy Left breast by Dr. Bary Castilla 2019. She denies any breast masses, DC or pain. Mammo pers. Reviewed showing no evidence of concerning lesions. He denies any fevers any chills any weight loss.  She does have mother and sister hx breast CA and sister died at age 44.  She does have psoriatic arthritis and has been on biological rx w good results. SDHe follows w Rheumatology    Past Medical History:  Diagnosis Date   Allergic rhinitis    Atypical lobular hyperplasia (ALH) of breast 01/20/2018   Colon polyps    GERD (gastroesophageal reflux disease)    History of hiatal hernia    SMALL PER PT   Hyperlipemia    Hypertension    Postmenopausal bleeding    Rheumatoid arthritis ([place])    Dr Jefm Bryant   Vitamin D deficiency     Past Surgical History:  Procedure Laterality Date   APPENDECTOMY     BREAST BIOPSY Left 01/02/2018   x marker, LOQ, CYSTIC PAPILLARY APOCRINE METAPLASIA.    BREAST BIOPSY Left 01/02/2018   coil marker, UIQ, ATYPICAL LOBULAR HYPERPLASIA, CYSTIC PAPILLARY APOCRINE METAPLASIA   BREAST BIOPSY Left 01/20/2018   Procedure: BREAST BIOPSY;  Surgeon: Robert Bellow, MD;  Location: [facility];  Service: General;  Laterality: Left;   BREAST CYST ASPIRATION Left 1970's   negative   BREAST LUMPECTOMY Left 01/20/2018   Procedure: BREAST WIDE EXCISION;  Surgeon: Robert Bellow, MD;  Location: [place] [facility];  Service: General;  Laterality: Left;   COLONOSCOPY N/A 03/13/2021   Procedure: COLONOSCOPY;  Surgeon: [name], DO;  Location: [place];  Service: Gastroenterology;  Laterality: N/A;   COLONOSCOPY WITH PROPOFOL N/A 12/16/2015   Procedure: COLONOSCOPY WITH PROPOFOL;  Surgeon: Lollie Sails, MD;  Location: [facility] ENDOSCOPY;  Service: Endoscopy;   Laterality: N/A;   ESOPHAGOGASTRODUODENOSCOPY N/A 03/13/2021   Procedure: ESOPHAGOGASTRODUODENOSCOPY (EGD);  Surgeon: [name], DO;  Location: [facility] ENDOSCOPY;  Service: Gastroenterology;  Laterality: N/A;   ESOPHAGOGASTRODUODENOSCOPY (EGD) WITH PROPOFOL N/A 12/16/2015   Procedure: ESOPHAGOGASTRODUODENOSCOPY (EGD) WITH PROPOFOL;  Surgeon: Lollie Sails, MD;  Location: [facility] ENDOSCOPY;  Service: Endoscopy;  Laterality: N/A;   TONSILLECTOMY      Family History  Problem Relation Age of Onset   Colon cancer Mother 34   Breast cancer Sister 73   Breast cancer Maternal Aunt 38    Social History:  reports that she has never smoked. She has been exposed to tobacco smoke. She has never used smokeless tobacco. She reports that she does not drink alcohol and does not use drugs.  Allergies:  Allergies  Allergen Reactions   Doxycycline Hives    Medications reviewed.    ROS Full ROS performed and is otherwise negative other than what is stated in HPI   BP 122/84   Pulse 71   Temp 98 F (36.7 C)   Ht '5\' 9"'$  (1.753 m)   Wt 270 lb (122.5 kg)   SpO2 98%   BMI 39.87 kg/m   Physical Exam Chaperone present Physical Exam Eyes:     General: No scleral icterus.       Right eye: No discharge.        Left eye: No discharge.     Extraocular  Movements: Extraocular movements intact.  Neck:     Musculoskeletal: Normal range of motion and neck supple. No neck rigidity or muscular tenderness.  Cardiovascular:     Rate and Rhythm: Normal rate and regular rhythm.     Pulses: Normal pulses.  Pulmonary:     Effort: Pulmonary effort is normal. No respiratory distress.     Breath sounds: No stridor. No wheezing.     Comments: BREAST: NO masses. nml nipple and skin. No axillary LAD. Previous lumpectomy scar on the left breast Abdominal:     General: Abdomen is flat. There is no distension.     Palpations: There is no mass.     Tenderness: There is no abdominal tenderness.      Hernia: No hernia is present.  Lymphadenopathy:     Cervical: No cervical adenopathy.  Skin:    Capillary Refill: Capillary refill takes less than 2 seconds.  Neurological:     General: No focal deficit present.     Mental Status: She is oriented to person, place, and time.  Psychiatric:        Mood and Affect: Mood normal.        Behavior: Behavior normal.        Thought Content: Thought content normal.        Judgment: Judgment normal.    Assessment/Plan: 71 year old female with a history of left breast atypical lobular hyperplasia status post excisional biopsy in 2019.  Currently there is no evidence of any suspicious lesions.  We will continue yearly mammogram and physical exams. No need for further biopsies or w/u.  All questions were answered.   Please note that I have spent 30 minutes in this encounter including personally reviewing imaging studies, counseling the patient ,coordinating her care, placing orders and performing appropriate documentation.  Caroleen Hamman, MD [facility] General Surgeon

## 2022-02-14 ENCOUNTER — Ambulatory Visit: Payer: Self-pay | Admitting: Dermatology

## 2022-04-04 NOTE — H&P (Signed)
Pre-Procedure H&P   Patient ID: Annette Bruce is a 72 y.o. female.  Gastroenterology Provider: Jaynie Collins, DO  Referring Provider: Tawni Pummel, PA PCP: Gracelyn Nurse, MD  Date: 04/05/2022  HPI Annette Bruce is a 72 y.o. female who presents today for Esophagogastroduodenoscopy for Dysphagia, gastric polyps .  Patient has longstanding history of reflux and dysphagia.  There is dysphagia to breads and potatoes.  No odynophagia or weight loss.  She has intermittent reflux.  She has a history of repeat EGD demonstrating gastritis and esophagitis in the past as well as gastric polyps.  She has had a history of fundic gland polyps and hyperplastic polyps.  Most recent EGD in December 2022 with normal duodenum.  Gastric polyps noted with pathology revealing hyperplastic without dysplasia in the incisura.  Biopsies negative for H. pylori.  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  GEJ proximately 38 cm from the incisors  Currently on rinvoq for psoriatic arthritis  Colorectal cancer in mother (90s) and maternal grandmother   Past Medical History:  Diagnosis Date   Allergic rhinitis    Atypical lobular hyperplasia (ALH) of breast 01/20/2018   Colon polyps    GERD (gastroesophageal reflux disease)    History of hiatal hernia    SMALL PER PT   Hyperlipemia    Hypertension    Postmenopausal bleeding    Rheumatoid arthritis ([facility])    Dr Gavin Potters   Vitamin D deficiency     Past Surgical History:  Procedure Laterality Date   APPENDECTOMY     BREAST BIOPSY Left 01/02/2018   x marker, LOQ, CYSTIC PAPILLARY APOCRINE METAPLASIA.    BREAST BIOPSY Left 01/02/2018   coil marker, UIQ, ATYPICAL LOBULAR HYPERPLASIA, CYSTIC PAPILLARY APOCRINE METAPLASIA   BREAST BIOPSY Left 01/20/2018   Procedure: BREAST BIOPSY;  Surgeon: Earline Mayotte, MD;  Location: [facility] ORS;  Service: General;   Laterality: Left;   BREAST CYST ASPIRATION Left 1970's   negative   BREAST LUMPECTOMY Left 01/20/2018   Procedure: BREAST WIDE EXCISION;  Surgeon: Earline Mayotte, MD;  Location: [facility] ORS;  Service: General;  Laterality: Left;   COLONOSCOPY N/A 03/13/2021   Procedure: COLONOSCOPY;  Surgeon: Jaynie Collins, DO;  Location: [facility] ENDOSCOPY;  Service: Gastroenterology;  Laterality: N/A;   COLONOSCOPY WITH PROPOFOL N/A 12/16/2015   Procedure: COLONOSCOPY WITH PROPOFOL;  Surgeon: Christena Deem, MD;  Location: [facility] ENDOSCOPY;  Service: Endoscopy;  Laterality: N/A;   ESOPHAGOGASTRODUODENOSCOPY N/A 03/13/2021   Procedure: ESOPHAGOGASTRODUODENOSCOPY (EGD);  Surgeon: Jaynie Collins, DO;  Location: [facility] ENDOSCOPY;  Service: Gastroenterology;  Laterality: N/A;   ESOPHAGOGASTRODUODENOSCOPY (EGD) WITH PROPOFOL N/A 12/16/2015   Procedure: ESOPHAGOGASTRODUODENOSCOPY (EGD) WITH PROPOFOL;  Surgeon: Christena Deem, MD;  Location: [facility] ENDOSCOPY;  Service: Endoscopy;  Laterality: N/A;   HYSTEROSCOPY     TONSILLECTOMY      Family History Colorectal cancer in mother (90s) and maternal grandmother No otherh/o GI disease or malignancy  Review of Systems  Constitutional:  Negative for activity change, appetite change, chills, diaphoresis, fatigue, fever and unexpected weight change.  HENT:  Positive for trouble swallowing. Negative for voice change.   Respiratory:  Negative for shortness of breath and wheezing.   Cardiovascular:  Negative for chest pain, palpitations and leg swelling.  Gastrointestinal:  Negative for abdominal distention, abdominal pain, anal bleeding, blood in stool, constipation, diarrhea, nausea, rectal pain and vomiting.  Musculoskeletal:  Negative for arthralgias and myalgias.  Skin:  Negative for color change and pallor.  Neurological:  Negative for dizziness, syncope and weakness.  Psychiatric/Behavioral:  Negative for confusion.   All other systems reviewed and  are negative.    Medications No current facility-administered medications on file prior to encounter.   Current Outpatient Medications on File Prior to Encounter  Medication Sig Dispense Refill   esomeprazole (NEXIUM) 20 MG capsule Take by mouth.     hydrochlorothiazide (HYDRODIURIL) 25 MG tablet Take 25 mg by mouth daily.     loratadine (CLARITIN) 10 MG tablet Take 10 mg by mouth daily.     Calcium-Vitamin D-Vitamin K (CALCIUM FOR WOMEN) 500-100-40 MG-UNT-MCG CHEW Chew by mouth.     ketoconazole (NIZORAL) 2 % cream APPLY TOPICALLY TO THE AFFECTED AREA EVERY OTHER DAY 60 g 6   mometasone (ELOCON) 0.1 % cream APPLY TOPICALLY TO THE AFFECTED AREA EVERY OTHER DAY 45 g 6   Multiple Vitamin (MULTIVITAMIN ADULT PO) Take by mouth.     Probiotic Product (ALIGN PO) Take by mouth daily.     simvastatin (ZOCOR) 40 MG tablet Take 40 mg by mouth daily.      Pertinent medications related to GI and procedure were reviewed by me with the patient prior to the procedure   Current Facility-Administered Medications:    0.9 %  sodium chloride infusion, , Intravenous, Continuous, Jaynie Collins, DO, Last Rate: 20 mL/hr at 04/05/22 0926, New Bag at 04/05/22 6045      Allergies  Allergen Reactions   Doxycycline Hives   Allergies were reviewed by me prior to the procedure  Objective   Body mass index is 39.87 kg/m. Vitals:   04/05/22 0911  BP: (!) 149/73  Pulse: 72  Resp: 18  Temp: 97.9 F (36.6 C)  TempSrc: Temporal  SpO2: 97%  Weight: 122.5 kg  Height: 5\' 9"  (1.753 m)     Physical Exam Vitals and nursing note reviewed.  Constitutional:      General: She is not in acute distress.    Appearance: Normal appearance. She is obese. She is not ill-appearing, toxic-appearing or diaphoretic.  HENT:     Head: Normocephalic and atraumatic.     Nose: Nose normal.     Mouth/Throat:     Mouth: Mucous membranes are moist.     Pharynx: Oropharynx is clear.  Eyes:     General: No  scleral icterus.    Extraocular Movements: Extraocular movements intact.  Cardiovascular:     Rate and Rhythm: Normal rate and regular rhythm.     Heart sounds: Normal heart sounds. No murmur heard.    No friction rub. No gallop.  Pulmonary:     Effort: Pulmonary effort is normal. No respiratory distress.     Breath sounds: Normal breath sounds. No wheezing, rhonchi or rales.  Abdominal:     General: Bowel sounds are normal. There is no distension.     Palpations: Abdomen is soft.     Tenderness: There is no abdominal tenderness. There is no guarding or rebound.  Musculoskeletal:     Cervical back: Neck supple.     Right lower leg: No edema.     Left lower leg: No edema.  Skin:    General: Skin is warm and dry.     Coloration: Skin is not jaundiced or pale.  Neurological:     General: No focal deficit present.     Mental Status: She is alert and  oriented to person, place, and time. Mental status is at baseline.  Psychiatric:        Mood and Affect: Mood normal.        Behavior: Behavior normal.        Thought Content: Thought content normal.        Judgment: Judgment normal.      Assessment:  Annette Bruce is a 72 y.o. female  who presents today for Esophagogastroduodenoscopy for gastric polyps, dysphagia.  Plan:  Esophagogastroduodenoscopy with possible intervention today  Esophagogastroduodenoscopy with possible biopsy, control of bleeding, polypectomy, and interventions as necessary has been discussed with the patient/patient representative. Informed consent was obtained from the patient/patient representative after explaining the indication, nature, and risks of the procedure including but not limited to death, bleeding, perforation, missed neoplasm/lesions, cardiorespiratory compromise, and reaction to medications. Opportunity for questions was given and appropriate answers were provided. Patient/patient representative has verbalized understanding is amenable to  undergoing the procedure.   Jaynie Collins, DO  [facility]  Portions of the record may have been created with voice recognition software. Occasional wrong-word or 'sound-a-like' substitutions may have occurred due to the inherent limitations of voice recognition software.  Read the chart carefully and recognize, using context, where substitutions may have occurred.

## 2022-04-05 ENCOUNTER — Encounter: Payer: Self-pay | Admitting: Gastroenterology

## 2022-04-05 ENCOUNTER — Encounter
Admission: RE | Disposition: A | Discharge status: Home / Self Care | Payer: Self-pay | Source: Home / Self Care | Attending: Gastroenterology

## 2022-04-05 ENCOUNTER — Ambulatory Visit: Payer: Medicare PPO | Admitting: Anesthesiology

## 2022-04-05 ENCOUNTER — Ambulatory Visit
Admission: RE | Admit: 2022-04-05 | Discharge: 2022-04-05 | Disposition: A | Discharge status: Home / Self Care | Payer: Medicare PPO | Attending: Gastroenterology | Admitting: Gastroenterology

## 2022-04-05 DIAGNOSIS — R1310 Dysphagia, unspecified: Secondary | ICD-10-CM | POA: Diagnosis present

## 2022-04-05 DIAGNOSIS — K317 Polyp of stomach and duodenum: Secondary | ICD-10-CM | POA: Insufficient documentation

## 2022-04-05 DIAGNOSIS — K2100 Gastro-esophageal reflux disease with esophagitis, without bleeding: Secondary | ICD-10-CM | POA: Diagnosis not present

## 2022-04-05 DIAGNOSIS — K224 Dyskinesia of esophagus: Secondary | ICD-10-CM | POA: Insufficient documentation

## 2022-04-05 DIAGNOSIS — L4050 Arthropathic psoriasis, unspecified: Secondary | ICD-10-CM | POA: Insufficient documentation

## 2022-04-05 DIAGNOSIS — K449 Diaphragmatic hernia without obstruction or gangrene: Secondary | ICD-10-CM | POA: Diagnosis not present

## 2022-04-05 DIAGNOSIS — Z800 Family history of malignant neoplasm of digestive organs: Secondary | ICD-10-CM | POA: Diagnosis not present

## 2022-04-05 DIAGNOSIS — K225 Diverticulum of esophagus, acquired: Secondary | ICD-10-CM | POA: Insufficient documentation

## 2022-04-05 DIAGNOSIS — Z79899 Other long term (current) drug therapy: Secondary | ICD-10-CM | POA: Diagnosis not present

## 2022-04-05 DIAGNOSIS — K222 Esophageal obstruction: Secondary | ICD-10-CM | POA: Diagnosis not present

## 2022-04-05 HISTORY — PX: ESOPHAGOGASTRODUODENOSCOPY (EGD) WITH PROPOFOL: SHX5813

## 2022-04-05 SURGERY — ESOPHAGOGASTRODUODENOSCOPY (EGD) WITH PROPOFOL
Anesthesia: General

## 2022-04-05 MED ORDER — SODIUM CHLORIDE 0.9 % IV SOLN: INTRAVENOUS | Status: DC

## 2022-04-05 MED ORDER — PROPOFOL 500 MG/50ML IV EMUL
INTRAVENOUS | Status: DC | PRN
  Administered 2022-04-05: 200 ug/kg/min via INTRAVENOUS

## 2022-04-05 MED ORDER — PROPOFOL 10 MG/ML IV BOLUS
INTRAVENOUS | Status: DC | PRN
  Administered 2022-04-05: 90 mg via INTRAVENOUS

## 2022-04-05 NOTE — Op Note (Addendum)
[facility] Gastroenterology Patient Name: Annette Bruce Procedure Date: 04/05/2022 9:29 AM MRN: [id number] Account #: [id number] Date of Birth: [birth year] Admit Type: Outpatient Age: 72 Room: [facility] ENDO ROOM 1 Gender: Female Note Status: Finalized Instrument Name: Upper Endoscope [id number] Procedure:             Upper GI endoscopy Indications:           Follow-up of gastric polyps Providers:             Rueben Bash, DO Referring MD:          Baxter Hire, MD (Referring MD) Medicines:             Monitored Anesthesia Care Complications:         No immediate complications. Estimated blood loss:                         Minimal. Procedure:             Pre-Anesthesia Assessment:                        - Prior to the procedure, a History and Physical was                         performed, and patient medications and allergies were                         reviewed. The patient is competent. The risks and                         benefits of the procedure and the sedation options and                         risks were discussed with the patient. All questions                         were answered and informed consent was obtained.                         Patient identification and proposed procedure were                         verified by the physician, the nurse, the anesthetist                         and the technician in the endoscopy suite. Mental                         Status Examination: alert and oriented. Airway                         Examination: normal oropharyngeal airway and neck                         mobility. Respiratory Examination: clear to                         auscultation. CV Examination: RRR, no murmurs, no S3  or S4. Prophylactic Antibiotics: The patient does not                         require prophylactic antibiotics. Prior                         Anticoagulants: The patient has taken no anticoagulant                          or antiplatelet agents. ASA Grade Assessment: III - A                         patient with severe systemic disease. After reviewing                         the risks and benefits, the patient was deemed in                         satisfactory condition to undergo the procedure. The                         anesthesia plan was to use monitored anesthesia care                         (MAC). Immediately prior to administration of                         medications, the patient was re-assessed for adequacy                         to receive sedatives. The heart rate, respiratory                         rate, oxygen saturations, blood pressure, adequacy of                         pulmonary ventilation, and response to care were                         monitored throughout the procedure. The physical                         status of the patient was re-assessed after the                         procedure.                        After obtaining informed consent, the endoscope was                         passed under direct vision. Throughout the procedure,                         the patient's blood pressure, pulse, and oxygen                         saturations were monitored continuously. The Endoscope  was introduced through the mouth, and advanced to the                         second part of duodenum. The upper GI endoscopy was                         accomplished without difficulty. The patient tolerated                         the procedure well. Findings:      The duodenal bulb, first portion of the duodenum and second portion of       the duodenum were normal. Estimated blood loss: none.      The Z-line was regular. Estimated blood loss: none.      Esophagogastric landmarks were identified: the gastroesophageal junction       was found at 36 cm from the incisors.      A 3 cm hiatal hernia was present. Estimated blood loss: none.      A widely  patent Schatzki ring was found at the gastroesophageal       junction. This was biopsied with a cold forceps for breaking up schatski       ring. Estimated blood loss was minimal.      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 Estimated blood loss:       none.      Abnormal motility was noted in the esophagus. The cricopharyngeus was       normal. There is spasticity of the esophageal body. The distal       esophagus/lower esophageal sphincter is open. Tertiary peristaltic waves       are noted. Estimated blood loss: none.      Multiple <1cm sessile polyps with no bleeding and no stigmata of recent       bleeding were found in the gastric body. Consistent with fundic gland       polyps. Estimated blood loss: none.      A single medium sessile polyp with no bleeding and no stigmata of recent       bleeding was found in the gastric antrum. Polyp appeared to be overlying       prominent fold at the antrum into pylorus. Biopsies were taken with a       cold forceps for histology. Estimated blood loss was minimal.      The exam of the stomach was otherwise normal. Impression:            - Normal duodenal bulb, first portion of the duodenum                         and second portion of the duodenum.                        - Z-line regular.                        - Esophagogastric landmarks identified.                        - 3 cm hiatal hernia.                        -  Widely patent Schatzki ring. Biopsied.                        - Zenker's diverticulum.                        - Abnormal esophageal motility, consistent with                         presbyesophagus.                        - Multiple gastric polyps.                        - A single gastric polyp. Biopsied. Recommendation:        - Patient has a contact number available for                         emergencies. The signs and symptoms of  potential                         delayed complications were discussed with the patient.                         Return to normal activities tomorrow. Written                         discharge instructions were provided to the patient.                        - Discharge patient to home.                        - Resume previous diet.                        - Continue present medications.                        - Await pathology results.                        - Consider ENT or GI referral for zenker repair. May                         need advanced endoscopy pending results of biopsies of                         polyp overlying large antral fold.                        - Return to GI clinic as previously scheduled.                        - The findings and recommendations were discussed with                         the patient. Procedure Code(s):     --- Professional ---  58309, Esophagogastroduodenoscopy, flexible,                         transoral; with biopsy, single or multiple Diagnosis Code(s):     --- Professional ---                        K44.9, Diaphragmatic hernia without obstruction or                         gangrene                        K22.2, Esophageal obstruction                        K22.5, Diverticulum of esophagus, acquired                        K22.4, Dyskinesia of esophagus                        K31.7, Polyp of stomach and duodenum CPT copyright 2022 American [employer] Association. All rights reserved. The codes documented in this report are preliminary and upon coder review may  be revised to meet current compliance requirements. Attending Participation:      I personally performed the entire procedure. Volney American, DO [name] DO, DO 04/05/2022 10:12:37 AM This report has been signed electronically. Number of Addenda: 0 Note Initiated On: 04/05/2022 9:29 AM Estimated Blood Loss:  Estimated blood loss was minimal.       [facility]

## 2022-04-05 NOTE — Anesthesia Preprocedure Evaluation (Addendum)
Anesthesia Evaluation  Patient identified by MRN, date of birth, ID band Patient awake    Reviewed: Allergy & Precautions, NPO status , Patient's Chart, lab work & pertinent test results  History of Anesthesia Complications Negative for: history of anesthetic complications  Airway Mallampati: III   Neck ROM: Full    Dental no notable dental hx.    Pulmonary neg pulmonary ROS   Pulmonary exam normal breath sounds clear to auscultation       Cardiovascular hypertension, Normal cardiovascular exam Rhythm:Regular Rate:Normal     Neuro/Psych negative neurological ROS     GI/Hepatic hiatal hernia,GERD  ,,  Endo/Other  Class 3 obesity  Renal/GU negative Renal ROS     Musculoskeletal  (+) Arthritis , Rheumatoid disorders,    Abdominal   Peds  Hematology negative hematology ROS (+)   Anesthesia Other Findings   Reproductive/Obstetrics                             Anesthesia Physical Anesthesia Plan  ASA: 3  Anesthesia Plan: General   Post-op Pain Management:    Induction: Intravenous  PONV Risk Score and Plan: 3 and Propofol infusion, TIVA and Treatment may vary due to age or medical condition  Airway Management Planned: Natural Airway  Additional Equipment:   Intra-op Plan:   Post-operative Plan:   Informed Consent: I have reviewed the patients History and Physical, chart, labs and discussed the procedure including the risks, benefits and alternatives for the proposed anesthesia with the patient or authorized representative who has indicated his/her understanding and acceptance.       Plan Discussed with: CRNA  Anesthesia Plan Comments: (LMA/GETA backup discussed.  Patient consented for risks of anesthesia including but not limited to:  - adverse reactions to medications - damage to eyes, teeth, lips or other oral mucosa - nerve damage due to positioning  - sore throat or  hoarseness - damage to heart, brain, nerves, lungs, other parts of body or loss of life  Informed patient about role of CRNA in peri- and intra-operative care.  Patient voiced understanding.)       Anesthesia Quick Evaluation

## 2022-04-05 NOTE — Anesthesia Postprocedure Evaluation (Signed)
Anesthesia Post Note  Patient: Annette Bruce  Procedure(s) Performed: ESOPHAGOGASTRODUODENOSCOPY (EGD) WITH PROPOFOL  Patient location during evaluation: PACU Anesthesia Type: General Level of consciousness: awake and alert, oriented and patient cooperative Pain management: pain level controlled Vital Signs Assessment: post-procedure vital signs reviewed and stable Respiratory status: spontaneous breathing, nonlabored ventilation and respiratory function stable Cardiovascular status: blood pressure returned to baseline and stable Postop Assessment: adequate PO intake Anesthetic complications: no   No notable events documented.   Last Vitals:  Vitals:   04/05/22 1023 04/05/22 1033  BP: 107/68 110/71  Pulse: 63 (!) 56  Resp: 18 15  Temp:    SpO2: 96% 97%    Last Pain:  Vitals:   04/05/22 1033  TempSrc:   PainSc: 0-No pain                 Darrin Nipper

## 2022-04-05 NOTE — Transfer of Care (Signed)
Immediate Anesthesia Transfer of Care Note  Patient: Annette Bruce  Procedure(s) Performed: ESOPHAGOGASTRODUODENOSCOPY (EGD) WITH PROPOFOL  Patient Location: Endoscopy Unit  Anesthesia Type:General  Level of Consciousness: awake, alert , and oriented  Airway & Oxygen Therapy: Patient Spontanous Breathing  Post-op Assessment: Report given to RN and Post -op Vital signs reviewed and stable  Post vital signs: Reviewed and stable  Last Vitals:  Vitals Value Taken Time  BP 97/64 04/05/22 1012  Temp 36.3 C 04/05/22 1011  Pulse 64 04/05/22 1016  Resp 21 04/05/22 1016  SpO2 98 % 04/05/22 1016  Vitals shown include unvalidated device data.  Last Pain:  Vitals:   04/05/22 1011  TempSrc: Temporal  PainSc: 0-No pain         Complications: No notable events documented.

## 2022-04-05 NOTE — Interval H&P Note (Signed)
History and Physical Interval Note: Preprocedure H&P from 04/05/22  was reviewed and there was no interval change after seeing and examining the patient.  Written consent was obtained from the patient after discussion of risks, benefits, and alternatives. Patient has consented to proceed with Esophagogastroduodenoscopy with possible intervention   04/05/2022 9:38 AM  Annette Bruce  has presented today for surgery, with the diagnosis of GERD.  The various methods of treatment have been discussed with the patient and family. After consideration of risks, benefits and other options for treatment, the patient has consented to  Procedure(s): ESOPHAGOGASTRODUODENOSCOPY (EGD) WITH PROPOFOL (N/A) as a surgical intervention.  The patient's history has been reviewed, patient examined, no change in status, stable for surgery.  I have reviewed the patient's chart and labs.  Questions were answered to the patient's satisfaction.     [name]

## 2022-04-06 ENCOUNTER — Encounter: Payer: Self-pay | Admitting: Gastroenterology

## 2022-04-06 LAB — SURGICAL PATHOLOGY

## 2022-07-05 ENCOUNTER — Ambulatory Visit: Payer: Medicare PPO | Admitting: Dermatology

## 2022-08-16 ENCOUNTER — Ambulatory Visit: Payer: Medicare PPO | Admitting: Dermatology

## 2022-08-16 VITALS — BP 143/89

## 2022-08-16 DIAGNOSIS — L578 Other skin changes due to chronic exposure to nonionizing radiation: Secondary | ICD-10-CM

## 2022-08-16 DIAGNOSIS — L814 Other melanin hyperpigmentation: Secondary | ICD-10-CM | POA: Diagnosis not present

## 2022-08-16 DIAGNOSIS — W908XXA Exposure to other nonionizing radiation, initial encounter: Secondary | ICD-10-CM

## 2022-08-16 DIAGNOSIS — Z79899 Other long term (current) drug therapy: Secondary | ICD-10-CM

## 2022-08-16 DIAGNOSIS — L918 Other hypertrophic disorders of the skin: Secondary | ICD-10-CM

## 2022-08-16 DIAGNOSIS — D1801 Hemangioma of skin and subcutaneous tissue: Secondary | ICD-10-CM

## 2022-08-16 DIAGNOSIS — L820 Inflamed seborrheic keratosis: Secondary | ICD-10-CM | POA: Diagnosis not present

## 2022-08-16 DIAGNOSIS — Z1283 Encounter for screening for malignant neoplasm of skin: Secondary | ICD-10-CM

## 2022-08-16 DIAGNOSIS — X32XXXA Exposure to sunlight, initial encounter: Secondary | ICD-10-CM

## 2022-08-16 DIAGNOSIS — L408 Other psoriasis: Secondary | ICD-10-CM

## 2022-08-16 DIAGNOSIS — L821 Other seborrheic keratosis: Secondary | ICD-10-CM

## 2022-08-16 DIAGNOSIS — L719 Rosacea, unspecified: Secondary | ICD-10-CM

## 2022-08-16 DIAGNOSIS — L409 Psoriasis, unspecified: Secondary | ICD-10-CM

## 2022-08-16 DIAGNOSIS — L4050 Arthropathic psoriasis, unspecified: Secondary | ICD-10-CM

## 2022-08-16 DIAGNOSIS — L304 Erythema intertrigo: Secondary | ICD-10-CM

## 2022-08-16 DIAGNOSIS — D229 Melanocytic nevi, unspecified: Secondary | ICD-10-CM

## 2022-08-16 DIAGNOSIS — Z7189 Other specified counseling: Secondary | ICD-10-CM

## 2022-08-16 MED ORDER — IVERMECTIN 1 % EX CREA: 1.0000 | TOPICAL_CREAM | CUTANEOUS | 11 refills | Status: AC

## 2022-08-16 NOTE — Patient Instructions (Signed)
Instructions for [facility] Medications  One or more of your medications was sent to the [facility] mail order compounding pharmacy. You will receive an email from them and can purchase the medicine through that link. It will then be mailed to your home at the address you confirmed. If for any reason you do not receive an email from them, please check your spam folder. If you still do not find the email, please let us know. [facility] phone number is [phone].    Cryotherapy Aftercare  Wash gently with soap and water everyday.   Apply Vaseline and Band-Aid daily until healed.    Due to recent changes in healthcare laws, you may see results of your pathology and/or laboratory studies on MyChart before the doctors have had a chance to review them. We understand that in some cases there may be results that are confusing or concerning to you. Please understand that not all results are received at the same time and often the doctors may need to interpret multiple results in order to provide you with the best plan of care or course of treatment. Therefore, we ask that you please give us 2 business days to thoroughly review all your results before contacting the office for clarification. Should we see a critical lab result, you will be contacted sooner.   If You Need Anything After Your Visit  If you have any questions or concerns for your doctor, please call our main line at [phone] and press option 4 to reach your doctor's medical assistant. If no one answers, please leave a voicemail as directed and we will return your call as soon as possible. Messages left after 4 pm will be answered the following business day.   You may also send us a message via MyChart. We typically respond to MyChart messages within 1-2 business days.  For prescription refills, please ask your pharmacy to contact our office. Our fax number is [phone].  If you have an urgent issue when the clinic  is closed that cannot wait until the next business day, you can page your doctor at the number below.    Please note that while we do our best to be available for urgent issues outside of office hours, we are not available 24/7.   If you have an urgent issue and are unable to reach us, you may choose to seek medical care at your doctor's office, retail clinic, urgent care center, or emergency room.  If you have a medical emergency, please immediately call 911 or go to the emergency department.  Pager Numbers  - Dr. Kowalski: [phone]  - Dr. Moye: [phone]  - Dr. Stewart: [phone]  In the event of inclement weather, please call our main line at [phone] for an update on the status of any delays or closures.  Dermatology Medication Tips: Please keep the boxes that topical medications come in in order to help keep track of the instructions about where and how to use these. Pharmacies typically print the medication instructions only on the boxes and not directly on the medication tubes.   If your medication is too expensive, please contact our office at [phone] option 4 or send us a message through MyChart.   We are unable to tell what your co-pay for medications will be in advance as this is different depending on your insurance coverage. However, we may be able to find a substitute medication at lower cost or fill out paperwork to get insurance to cover   a needed medication.   If a prior authorization is required to get your medication covered by your insurance company, please allow us 1-2 business days to complete this process.  Drug prices often vary depending on where the prescription is filled and some pharmacies may offer cheaper prices.  The website www.goodrx.com contains coupons for medications through different pharmacies. The prices here do not account for what the cost may be with help from insurance (it may be cheaper with your insurance), but the website  can give you the price if you did not use any insurance.  - You can print the associated coupon and take it with your prescription to the pharmacy.  - You may also stop by our office during regular business hours and pick up a GoodRx coupon card.  - If you need your prescription sent electronically to a different pharmacy, notify our office through [facility] MyChart or by phone at [phone] option 4.     Si Usted Necesita Algo Despus de Su Visita  Tambin puede enviarnos un mensaje a travs de MyChart. Por lo general respondemos a los mensajes de MyChart en el transcurso de 1 a 2 das hbiles.  Para renovar recetas, por favor pida a su farmacia que se ponga en contacto con nuestra oficina. Nuestro nmero de fax es el [phone].  Si tiene un asunto urgente cuando la clnica est cerrada y que no puede esperar hasta el siguiente da hbil, puede llamar/localizar a su doctor(a) al nmero que aparece a continuacin.   Por favor, tenga en cuenta que aunque hacemos todo lo posible para estar disponibles para asuntos urgentes fuera del horario de oficina, no estamos disponibles las 24 horas del da, los 7 das de la semana.   Si tiene un problema urgente y no puede comunicarse con nosotros, puede optar por buscar atencin mdica  en el consultorio de su doctor(a), en una clnica privada, en un centro de atencin urgente o en una sala de emergencias.  Si tiene una emergencia mdica, por favor llame inmediatamente al 911 o vaya a la sala de emergencias.  Nmeros de bper  - Dr. Kowalski: [phone]  - Dra. Moye: [phone]  - Dra. Stewart: [phone]  En caso de inclemencias del tiempo, por favor llame a nuestra lnea principal al [phone] para una actualizacin sobre el estado de cualquier retraso o cierre.  Consejos para la medicacin en dermatologa: Por favor, guarde las cajas en las que vienen los medicamentos de uso tpico para ayudarle a seguir las instrucciones  sobre dnde y cmo usarlos. Las farmacias generalmente imprimen las instrucciones del medicamento slo en las cajas y no directamente en los tubos del medicamento.   Si su medicamento es muy caro, por favor, pngase en contacto con nuestra oficina llamando al [phone] y presione la opcin 4 o envenos un mensaje a travs de MyChart.   No podemos decirle cul ser su copago por los medicamentos por adelantado ya que esto es diferente dependiendo de la cobertura de su seguro. Sin embargo, es posible que podamos encontrar un medicamento sustituto a menor costo o llenar un formulario para que el seguro cubra el medicamento que se considera necesario.   Si se requiere una autorizacin previa para que su compaa de seguros cubra su medicamento, por favor permtanos de 1 a 2 das hbiles para completar este proceso.  Los precios de los medicamentos varan con frecuencia dependiendo del lugar de dnde se surte la receta y alguna farmacias pueden ofrecer precios ms baratos.    El sitio web www.goodrx.com tiene cupones para medicamentos de diferentes farmacias. Los precios aqu no tienen en cuenta lo que podra costar con la ayuda del seguro (puede ser ms barato con su seguro), pero el sitio web puede darle el precio si no utiliz ningn seguro.  - Puede imprimir el cupn correspondiente y llevarlo con su receta a la farmacia.  - Tambin puede pasar por nuestra oficina durante el horario de atencin regular y recoger una tarjeta de cupones de GoodRx.  - Si necesita que su receta se enve electrnicamente a una farmacia diferente, informe a nuestra oficina a travs de MyChart de [place] o por telfono llamando al [phone] y presione la opcin 4.  

## 2022-08-16 NOTE — Progress Notes (Signed)
Follow-Up Visit   Subjective  Annette Bruce is a 72 y.o. female who presents for the following: Skin Cancer Screening and Full Body Skin Exam, irritating spots back at braline, R axilla  The patient presents for Total-Body Skin Exam (TBSE) for skin cancer screening and mole check. The patient has spots, moles and lesions to be evaluated, some may be new or changing and the patient has concerns that these could be cancer.    The following portions of the chart were reviewed this encounter and updated as appropriate: medications, allergies, medical history  Review of Systems:  No other skin or systemic complaints except as noted in HPI or Assessment and Plan.  Objective  Well appearing patient in no apparent distress; mood and affect are within normal limits.  A full examination was performed including scalp, head, eyes, ears, nose, lips, neck, chest, axillae, abdomen, back, buttocks, bilateral upper extremities, bilateral lower extremities, hands, feet, fingers, toes, fingernails, and toenails. All findings within normal limits unless otherwise noted below.   Relevant physical exam findings are noted in the Assessment and Plan.  back x 2 (2) Stuck on waxy paps with erythema  Right Axilla x 5 (5) Fleshy, skin-colored pedunculated papules.      Assessment & Plan   LENTIGINES, SEBORRHEIC KERATOSES, HEMANGIOMAS - Benign normal skin lesions - Benign-appearing - Call for any changes  MELANOCYTIC NEVI - Tan-brown and/or pink-flesh-colored symmetric macules and papules - Benign appearing on exam today - Observation - Call clinic for new or changing moles - Recommend daily use of broad spectrum spf 30+ sunscreen to sun-exposed areas.   ACTINIC DAMAGE - Chronic condition, secondary to cumulative UV/sun exposure - diffuse scaly erythematous macules with underlying dyspigmentation - Recommend daily broad spectrum sunscreen SPF 30+ to sun-exposed areas, reapply every 2 hours as  needed.  - Staying in the shade or wearing long sleeves, sun glasses (UVA+UVB protection) and wide brim hats (4-inch brim around the entire circumference of the hat) are also recommended for sun protection.  - Call for new or changing lesions.  SKIN CANCER SCREENING PERFORMED TODAY.  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 Currently well-controlled.     Psoriasis is a chronic non-curable, but treatable genetic/hereditary disease that may have other systemic features affecting other organ systems such as joints (Psoriatic Arthritis). It is associated with an increased risk of inflammatory bowel disease, heart disease, non-alcoholic fatty liver disease, and depression.  Treatments include light and laser treatments; topical medications; and systemic medications including oral and injectables.  Treatment Plan: Cont Rinvoq as prescribed by Rheumatologist Cont Mometasone cr qd 5d/wk prn flares  Topical steroids (such as triamcinolone, fluocinolone, fluocinonide, mometasone, clobetasol, halobetasol, betamethasone, hydrocortisone) can cause thinning and lightening of the skin if they are used for too long in the same area. Your physician has selected the right strength medicine for your problem and area affected on the body. Please use your medication only as directed by your physician to prevent side effects.     Inflamed seborrheic keratosis (2) back x 2  Symptomatic, irritating, patient would like treated.   Destruction of lesion - back x 2 Complexity: simple   Destruction method: cryotherapy   Informed consent: discussed and consent obtained   Timeout:  patient name, date of birth, surgical site, and procedure verified Lesion destroyed using liquid nitrogen: Yes   Region frozen until ice  ball extended beyond lesion: Yes   Outcome: patient tolerated procedure  well with no complications   Post-procedure details: wound care instructions given    Skin tag (5) Right Axilla x 5  Symptomatic, irritating, patient would like treated.   Destruction of lesion - Right Axilla x 5 Complexity: simple   Destruction method: cryotherapy   Informed consent: discussed and consent obtained   Timeout:  patient name, date of birth, surgical site, and procedure verified Lesion destroyed using liquid nitrogen: Yes   Region frozen until ice ball extended beyond lesion: Yes   Outcome: patient tolerated procedure well with no complications   Post-procedure details: wound care instructions given    INTERTRIGO with Psoriasis Inverse Inframammary, groin Exam clear today  Chronic condition with duration or expected duration over one year. Currently well-controlled.   Intertrigo is a chronic recurrent rash that occurs in skin fold areas that may be associated with friction; heat; moisture; yeast; fungus; and bacteria.  It is exacerbated by increased movement / activity; sweating; and higher atmospheric temperature.  Treatment Plan Cont Ketoconazole 2% cr qd prn flares   ROSACEA Exam Mid face erythema with telangiectasias +/- scattered inflammatory papules face  Chronic and persistent condition with duration or expected duration over one year. Condition is symptomatic/ bothersome to patient. Not currently at goal.   Rosacea is a chronic progressive skin condition usually affecting the face of adults, causing redness and/or acne bumps. It is treatable but not curable. It sometimes affects the eyes (ocular rosacea) as well. It may respond to topical and/or systemic medication and can flare with stress, sun exposure, alcohol, exercise, topical steroids (including hydrocortisone/cortisone 10) and some foods.  Daily application of broad spectrum spf 30+ sunscreen to face is recommended to reduce flares.   Treatment Plan Start [facility] Triple Cream qd/bid to  face  Return in about 1 year (around 08/16/2023) for TBSE.  I, Annette Bruce, RMA, am acting as scribe for Annette Sans, MD .   Documentation: I have reviewed the above documentation for accuracy and completeness, and I agree with the above.  Annette Sans, MD

## 2022-08-30 ENCOUNTER — Encounter: Payer: Self-pay | Admitting: Dermatology

## 2022-11-09 ENCOUNTER — Other Ambulatory Visit: Payer: Self-pay

## 2022-11-09 DIAGNOSIS — Z1231 Encounter for screening mammogram for malignant neoplasm of breast: Secondary | ICD-10-CM

## 2023-01-01 ENCOUNTER — Ambulatory Visit
Admission: RE | Admit: 2023-01-01 | Discharge: 2023-01-01 | Disposition: A | Discharge status: Home / Self Care | Payer: Medicare PPO | Source: Ambulatory Visit | Attending: Surgery | Admitting: Surgery

## 2023-01-01 DIAGNOSIS — Z1231 Encounter for screening mammogram for malignant neoplasm of breast: Secondary | ICD-10-CM | POA: Diagnosis present

## 2023-01-07 ENCOUNTER — Encounter: Payer: Self-pay | Admitting: Surgery

## 2023-01-07 ENCOUNTER — Ambulatory Visit: Payer: Medicare PPO | Admitting: Surgery

## 2023-01-07 VITALS — BP 133/79 | HR 66 | Temp 98.0°F | Ht 69.0 in | Wt 259.0 lb

## 2023-01-07 DIAGNOSIS — N6092 Unspecified benign mammary dysplasia of left breast: Secondary | ICD-10-CM | POA: Diagnosis not present

## 2023-01-07 NOTE — Patient Instructions (Addendum)
Patient will be asked to return to the office in one year with a bilateral screening mammogram.  We will send you a letter about these appointments.   Continue self breast exams. Call office for any new breast issues or concerns.   Breast Self-Awareness Breast self-awareness is knowing how your breasts look and feel. You need to: Check your breasts on a regular basis. Tell your doctor about any changes. Become familiar with the look and feel of your breasts. This can help you catch a breast problem while it is still small and can be treated. You should do breast self-exams even if you have breast implants. What you need: A mirror. A well-lit room. A pillow or other soft object. How to do a breast self-exam Follow these steps to do a breast self-exam: Look for changes  Take off all the clothes above your waist. Stand in front of a mirror in a room with good lighting. Put your hands down at your sides. Compare your breasts in the mirror. Look for any difference between them, such as: A difference in shape. A difference in size. Wrinkles, dips, and bumps in one breast and not the other. Look at each breast for changes in the skin, such as: Redness. Scaly areas. Skin that has gotten thicker. Dimpling. Open sores (ulcers). Look for changes in your nipples, such as: Fluid coming out of a nipple. Fluid around a nipple. Bleeding. Dimpling. Redness. A nipple that looks pushed in (retracted), or that has changed position. Feel for changes Lie on your back. Feel each breast. To do this: Pick a breast to feel. Place a pillow under the shoulder closest to that breast. Put the arm closest to that breast behind your head. Feel the nipple area of that breast using the hand of your other arm. Feel the area with the pads of your three middle fingers by making small circles with your fingers. Use light, medium, and firm pressure. Continue the overlapping circles, moving downward over the  breast. Keep making circles with your fingers. Stop when you feel your ribs. Start making circles with your fingers again, this time going upward until you reach your collarbone. Then, make circles outward across your breast and into your armpit area. Squeeze your nipple. Check for discharge and lumps. Repeat these steps to check your other breast. Sit or stand in the tub or shower. With soapy water on your skin, feel each breast the same way you did when you were lying down. Write down what you find Writing down what you find can help you remember what to tell your doctor. Write down: What is normal for each breast. Any changes you find in each breast. These include: The kind of changes you find. A tender or painful breast. Any lump you find. Write down its size and where it is. When you last had your monthly period (menstrual cycle). General tips If you are breastfeeding, the best time to check your breasts is after you feed your baby or after you use a breast pump. If you get monthly bleeding, the best time to check your breasts is 5-7 days after your monthly cycle ends. With time, you will become comfortable with the self-exam. You will also start to know if there are changes in your breasts. Contact a doctor if: You see a change in the shape or size of your breasts or nipples. You see a change in the skin of your breast or nipples, such as red or scaly skin. You  have fluid coming from your nipples that is not normal. You find a new lump or thick area. You have breast pain. You have any concerns about your breast health. Summary Breast self-awareness includes looking for changes in your breasts and feeling for changes within your breasts. You should do breast self-awareness in front of a mirror in a well-lit room. If you get monthly periods (menstrual cycles), the best time to check your breasts is 5-7 days after your period ends. Tell your doctor about any changes you see in your  breasts. Changes include changes in size, changes on the skin, painful or tender breasts, or fluid from your nipples that is not normal. This information is not intended to replace advice given to you by your health care provider. Make sure you discuss any questions you have with your health care provider. Document Revised: 08/24/2021 Document Reviewed: 01/19/2021 Elsevier Patient Education  2024 [employer].

## 2023-01-09 ENCOUNTER — Encounter: Payer: Self-pay | Admitting: Surgery

## 2023-01-09 NOTE — Progress Notes (Addendum)
Outpatient Surgical Follow Up    Annette Bruce is an 72 y.o. female.   Chief Complaint  Patient presents with   Follow-up    HPI: Annette Bruce is a 72 yo female with hx of ALH after excisional biopsy Left breast by Dr. Lemar Livings 2019. She denies any breast masses, DC or pain. Mammo pers. Reviewed showing no evidence of concerning lesions. SHe denies any fevers any chills any weight loss. She does have mother and sister hx breast CA and sister died at age 78.  She does have psoriatic arthritis and has been on biological rx w good results. SHe follows w Rheumatology She had a recent Zenker's Diverticulectomy and feels better, swallowing pills better. No complications Recent EGD pers reviewed showing HH, zenker and Schatzky ring  Past Medical History:  Diagnosis Date   Allergic rhinitis    Atypical lobular hyperplasia (ALH) of breast 01/20/2018   Colon polyps    GERD (gastroesophageal reflux disease)    History of hiatal hernia    SMALL PER PT   Hyperlipemia    Hypertension    Postmenopausal bleeding    Rheumatoid arthritis (HCC)    Dr Gavin Potters   Vitamin D deficiency     Past Surgical History:  Procedure Laterality Date   APPENDECTOMY     BREAST BIOPSY Left 01/02/2018   x marker, LOQ, CYSTIC PAPILLARY APOCRINE METAPLASIA.    BREAST BIOPSY Left 01/02/2018   coil marker, UIQ, ATYPICAL LOBULAR HYPERPLASIA, CYSTIC PAPILLARY APOCRINE METAPLASIA   BREAST BIOPSY Left 01/20/2018   Procedure: BREAST BIOPSY;  Surgeon: Earline Mayotte, MD;  Location: [facility] ORS;  Service: General;  Laterality: Left;   BREAST CYST ASPIRATION Left 1970's   negative   BREAST LUMPECTOMY Left 01/20/2018   Procedure: BREAST WIDE EXCISION;  Surgeon: Earline Mayotte, MD;  Location: [facility] ORS;  Service: General;  Laterality: Left;   COLONOSCOPY N/A 03/13/2021   Procedure: COLONOSCOPY;  Surgeon: Jaynie Collins, DO;  Location: [facility] ENDOSCOPY;  Service: Gastroenterology;  Laterality: N/A;   COLONOSCOPY  WITH PROPOFOL N/A 12/16/2015   Procedure: COLONOSCOPY WITH PROPOFOL;  Surgeon: Christena Deem, MD;  Location: [facility] ENDOSCOPY;  Service: Endoscopy;  Laterality: N/A;   ESOPHAGOGASTRODUODENOSCOPY N/A 03/13/2021   Procedure: ESOPHAGOGASTRODUODENOSCOPY (EGD);  Surgeon: Jaynie Collins, DO;  Location: [facility] ENDOSCOPY;  Service: Gastroenterology;  Laterality: N/A;   ESOPHAGOGASTRODUODENOSCOPY (EGD) WITH PROPOFOL N/A 12/16/2015   Procedure: ESOPHAGOGASTRODUODENOSCOPY (EGD) WITH PROPOFOL;  Surgeon: Christena Deem, MD;  Location: [facility] ENDOSCOPY;  Service: Endoscopy;  Laterality: N/A;   ESOPHAGOGASTRODUODENOSCOPY (EGD) WITH PROPOFOL N/A 04/05/2022   Procedure: ESOPHAGOGASTRODUODENOSCOPY (EGD) WITH PROPOFOL;  Surgeon: Jaynie Collins, DO;  Location: [facility] ENDOSCOPY;  Service: Gastroenterology;  Laterality: N/A;   HYSTEROSCOPY     TONSILLECTOMY      Family History  Problem Relation Age of Onset   Colon cancer Mother 64   Breast cancer Sister 9   Breast cancer Maternal Aunt 74    Social History:  reports that she has never smoked. She has been exposed to tobacco smoke. She has never used smokeless tobacco. She reports that she does not drink alcohol and does not use drugs.  Allergies:  Allergies  Allergen Reactions   Doxycycline Hives    Medications reviewed.    ROS Full ROS performed and is otherwise negative other than what is stated in HPI   BP 133/79   Pulse 66   Temp 98 F (36.7 C)   Ht 5\' 9"  (1.753 m)  Wt 259 lb (117.5 kg)   SpO2 97%   BMI 38.25 kg/m   Physical Exam  Chaperone present Physical Exam Eyes:     General: No scleral icterus.       Right eye: No discharge.        Left eye: No discharge.     Extraocular Movements: Extraocular movements intact.  Neck:     Musculoskeletal: Normal range of motion and neck supple. No neck rigidity or muscular tenderness.  Cardiovascular:     Rate and Rhythm: Normal rate and regular rhythm.     Pulses: Normal  pulses.  Pulmonary:     Effort: Pulmonary effort is normal. No respiratory distress.     Breath sounds: No stridor. No wheezing.     Comments: BREAST: NO masses. nml nipple and skin. No axillary LAD. Previous lumpectomy scar on the left breast Abdominal:     General: Abdomen is flat. There is no distension.     Palpations: There is no mass.     Tenderness: There is no abdominal tenderness.     Hernia: No hernia is present.  Lymphadenopathy:     Cervical: No cervical adenopathy.  Skin:    Capillary Refill: Capillary refill takes less than 2 seconds.  Neurological:     General: No focal deficit present.     Mental Status: She is oriented to person, place, and time.  Psychiatric:        Mood and Affect: Mood normal.        Behavior: Behavior normal.        Thought Content: Thought content normal.        Judgment: Judgment normal.    Assessment/Plan: 72 year old female with a history of left breast atypical lobular hyperplasia status post excisional biopsy in 2019.  Currently there is no evidence of any suspicious lesions.  We will continue yearly mammogram and physical exams. No need for further biopsies or w/u.  All questions were answered.   Please note that I have spent 30 minutes in this encounter including personally reviewing imaging studies, counseling the patient ,coordinating her care, placing orders and performing appropriate documentation.     Sterling Big, MD [facility] General Surgeon

## 2023-03-25 ENCOUNTER — Other Ambulatory Visit: Payer: Self-pay | Admitting: Dermatology

## 2023-03-25 DIAGNOSIS — L409 Psoriasis, unspecified: Secondary | ICD-10-CM

## 2023-03-25 DIAGNOSIS — L304 Erythema intertrigo: Secondary | ICD-10-CM

## 2023-07-16 ENCOUNTER — Ambulatory Visit: Admitting: Dermatology

## 2023-07-16 ENCOUNTER — Encounter: Payer: Self-pay | Admitting: Dermatology

## 2023-07-16 DIAGNOSIS — L578 Other skin changes due to chronic exposure to nonionizing radiation: Secondary | ICD-10-CM

## 2023-07-16 DIAGNOSIS — Z7189 Other specified counseling: Secondary | ICD-10-CM

## 2023-07-16 DIAGNOSIS — W908XXA Exposure to other nonionizing radiation, initial encounter: Secondary | ICD-10-CM | POA: Diagnosis not present

## 2023-07-16 DIAGNOSIS — B078 Other viral warts: Secondary | ICD-10-CM

## 2023-07-16 NOTE — Patient Instructions (Addendum)

## 2023-07-16 NOTE — Progress Notes (Signed)
   Follow-Up Visit   Subjective  Annette Bruce is a 73 y.o. female who presents for the following: growth L elbow, ~51m, gets irritated  The patient has spots, moles and lesions to be evaluated, some may be new or changing and the patient may have concern these could be cancer.  The following portions of the chart were reviewed this encounter and updated as appropriate: medications, allergies, medical history  Review of Systems:  No other skin or systemic complaints except as noted in HPI or Assessment and Plan.  Objective  Well appearing patient in no apparent distress; mood and affect are within normal limits.  A focused examination was performed of the following areas: arm  Relevant exam findings are noted in the Assessment and Plan.  L elbow x 1 Verrucous papules -- Discussed viral etiology and contagion 0.8 x 0.5cm L elbow  Assessment & Plan   OTHER VIRAL WARTS L elbow x 1 Vs ISK  Viral Wart (HPV) Counseling  Discussed viral / HPV (Human Papilloma Virus) etiology and risk of spread /infectivity to other areas of body as well as to other people.  Multiple treatments and methods may be required to clear warts and it is possible treatment may not be successful.  Treatment risks include discoloration; scarring and there is still potential for wart recurrence.  Symptomatic, irritating, patient would like treated.  Destruction of lesion - L elbow x 1 Complexity: simple   Destruction method: cryotherapy   Informed consent: discussed and consent obtained   Timeout:  patient name, date of birth, surgical site, and procedure verified Lesion destroyed using liquid nitrogen: Yes   Region frozen until ice ball extended beyond lesion: Yes   Outcome: patient tolerated procedure well with no complications   Post-procedure details: wound care instructions given    ACTINIC DAMAGE - chronic, secondary to cumulative UV radiation exposure/sun exposure over time - diffuse scaly  erythematous macules with underlying dyspigmentation - Recommend daily broad spectrum sunscreen SPF 30+ to sun-exposed areas, reapply every 2 hours as needed.  - Recommend staying in the shade or wearing long sleeves, sun glasses (UVA+UVB protection) and wide brim hats (4-inch brim around the entire circumference of the hat). - Call for new or changing lesions.  Return for as scheduled.  I, Rollie Clipper, RMA, am acting as scribe for Celine Collard, MD .   Documentation: I have reviewed the above documentation for accuracy and completeness, and I agree with the above.  Celine Collard, MD

## 2023-08-08 ENCOUNTER — Ambulatory Visit: Admitting: Dermatology

## 2023-08-21 ENCOUNTER — Ambulatory Visit: Payer: Medicare PPO | Admitting: Dermatology

## 2023-10-01 ENCOUNTER — Encounter: Payer: Self-pay | Admitting: Dermatology

## 2023-10-01 ENCOUNTER — Ambulatory Visit: Admitting: Dermatology

## 2023-10-01 DIAGNOSIS — L409 Psoriasis, unspecified: Secondary | ICD-10-CM

## 2023-10-01 DIAGNOSIS — D1801 Hemangioma of skin and subcutaneous tissue: Secondary | ICD-10-CM

## 2023-10-01 DIAGNOSIS — L820 Inflamed seborrheic keratosis: Secondary | ICD-10-CM

## 2023-10-01 DIAGNOSIS — Z1283 Encounter for screening for malignant neoplasm of skin: Secondary | ICD-10-CM | POA: Diagnosis not present

## 2023-10-01 DIAGNOSIS — D229 Melanocytic nevi, unspecified: Secondary | ICD-10-CM

## 2023-10-01 DIAGNOSIS — L4050 Arthropathic psoriasis, unspecified: Secondary | ICD-10-CM

## 2023-10-01 DIAGNOSIS — L821 Other seborrheic keratosis: Secondary | ICD-10-CM

## 2023-10-01 DIAGNOSIS — L408 Other psoriasis: Secondary | ICD-10-CM | POA: Diagnosis not present

## 2023-10-01 DIAGNOSIS — W908XXA Exposure to other nonionizing radiation, initial encounter: Secondary | ICD-10-CM

## 2023-10-01 DIAGNOSIS — Z7189 Other specified counseling: Secondary | ICD-10-CM

## 2023-10-01 DIAGNOSIS — L578 Other skin changes due to chronic exposure to nonionizing radiation: Secondary | ICD-10-CM

## 2023-10-01 DIAGNOSIS — L814 Other melanin hyperpigmentation: Secondary | ICD-10-CM

## 2023-10-01 DIAGNOSIS — Z79899 Other long term (current) drug therapy: Secondary | ICD-10-CM

## 2023-10-01 MED ORDER — MOMETASONE FUROATE 0.1 % EX CREA: TOPICAL_CREAM | CUTANEOUS | 3 refills | Status: AC

## 2023-10-01 NOTE — Progress Notes (Signed)
 Follow-Up Visit   Subjective  Annette Bruce is a 73 y.o. female who presents for the following: Skin Cancer Screening and Full Body Skin Exam  The patient presents for Total-Body Skin Exam (TBSE) for skin cancer screening and mole check. The patient has spots, moles and lesions to be evaluated, some may be new or changing and the patient may have concern these could be cancer.  The following portions of the chart were reviewed this encounter and updated as appropriate: medications, allergies, medical history  Review of Systems:  No other skin or systemic complaints except as noted in HPI or Assessment and Plan.  Objective  Well appearing patient in no apparent distress; mood and affect are within normal limits.  A full examination was performed including scalp, head, eyes, ears, nose, lips, neck, chest, axillae, abdomen, back, buttocks, bilateral upper extremities, bilateral lower extremities, hands, feet, fingers, toes, fingernails, and toenails. All findings within normal limits unless otherwise noted below.   Relevant physical exam findings are noted in the Assessment and Plan.  R tricep x 1, L dorsum hand x 1 (2) Stuck on waxy paps with erythema  Assessment & Plan   SKIN CANCER SCREENING PERFORMED TODAY.  ACTINIC DAMAGE - Chronic condition, secondary to cumulative UV/sun exposure - diffuse scaly erythematous macules with underlying dyspigmentation - Recommend daily broad spectrum sunscreen SPF 30+ to sun-exposed areas, reapply every 2 hours as needed.  - Staying in the shade or wearing long sleeves, sun glasses (UVA+UVB protection) and wide brim hats (4-inch brim around the entire circumference of the hat) are also recommended for sun protection.  - Call for new or changing lesions.  LENTIGINES, SEBORRHEIC KERATOSES, HEMANGIOMAS - Benign normal skin lesions - Benign-appearing - Call for any changes  MELANOCYTIC NEVI - Tan-brown and/or pink-flesh-colored symmetric  macules and papules - Benign appearing on exam today - Observation - Call clinic for new or changing moles - Recommend daily use of broad spectrum spf 30+ sunscreen to sun-exposed areas.   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 Currently well-controlled.  Psoriasis is a chronic non-curable, but treatable genetic/hereditary disease that may have other systemic features affecting other organ systems such as joints (Psoriatic Arthritis). It is associated with an increased risk of inflammatory bowel disease, heart disease, non-alcoholic fatty liver disease, and depression.  Treatments include light and laser treatments; topical medications; and systemic medications including oral and injectables. Treatment Plan: Cont Rinvoq as prescribed by Rheumatologist Cont Mometasone  cr qd 5d/wk prn flares   Topical steroids (such as triamcinolone, fluocinolone, fluocinonide, mometasone , clobetasol, halobetasol, betamethasone, hydrocortisone ) can cause thinning and lightening of the skin if they are used for too long in the same area. Your physician has selected the right strength medicine for your problem and area affected on the body. Please use your medication only as directed by your physician to prevent side effects.     INFLAMED SEBORRHEIC KERATOSIS (2) R tricep x 1, L dorsum hand x 1 (2) Symptomatic, irritating, patient would like treated.  Destruction of lesion - R tricep x 1, L dorsum hand x 1 (2) Complexity: simple   Destruction method: cryotherapy   Informed consent: discussed and consent obtained   Timeout:  patient name, date of birth, surgical site, and procedure verified Lesion destroyed using liquid nitrogen: Yes   Region frozen until ice ball extended beyond lesion: Yes   Outcome: patient  tolerated procedure well with no complications   Post-procedure details:  wound care instructions given   PSORIASIS   Related Medications mometasone  (ELOCON ) 0.1 % cream Apply to aa's BID PRN up to 5d/wk.  Return in about 1 year (around 09/30/2024) for TBSE - hx ISK and psoriasis/PsA/psoriasis inversa.  Annette Bruce, CMA, am acting as scribe for Alm Rhyme, MD .   Documentation: I have reviewed the above documentation for accuracy and completeness, and I agree with the above.  Alm Rhyme, MD

## 2023-10-01 NOTE — Patient Instructions (Signed)

## 2023-10-14 ENCOUNTER — Ambulatory Visit: Admitting: Dermatology

## 2023-11-28 ENCOUNTER — Other Ambulatory Visit: Payer: Self-pay | Admitting: Surgery

## 2023-11-28 DIAGNOSIS — Z1231 Encounter for screening mammogram for malignant neoplasm of breast: Secondary | ICD-10-CM

## 2024-01-02 ENCOUNTER — Ambulatory Visit
Admission: RE | Admit: 2024-01-02 | Discharge: 2024-01-02 | Disposition: A | Discharge status: Home / Self Care | Source: Ambulatory Visit | Attending: Surgery | Admitting: Surgery

## 2024-01-02 DIAGNOSIS — Z1231 Encounter for screening mammogram for malignant neoplasm of breast: Secondary | ICD-10-CM | POA: Diagnosis present

## 2024-01-13 ENCOUNTER — Encounter: Payer: Self-pay | Admitting: Surgery

## 2024-01-13 ENCOUNTER — Ambulatory Visit: Admitting: Surgery

## 2024-01-13 VITALS — BP 137/84 | HR 68 | Temp 98.1°F | Ht 69.0 in | Wt 264.0 lb

## 2024-01-13 DIAGNOSIS — Z1239 Encounter for other screening for malignant neoplasm of breast: Secondary | ICD-10-CM

## 2024-01-13 DIAGNOSIS — Z803 Family history of malignant neoplasm of breast: Secondary | ICD-10-CM

## 2024-01-13 DIAGNOSIS — N6092 Unspecified benign mammary dysplasia of left breast: Secondary | ICD-10-CM

## 2024-01-13 NOTE — Patient Instructions (Addendum)
 Patient will be asked to return to the office in one year with a bilateral screening mammogram. We will send you a letter about these appointments.    Continue self breast exams. Call office for any new breast issues or concerns.    How to Do a Breast Self-Exam Doing breast self-exams can help you stay healthy. They're one way to know what's normal for your breasts. They can help you catch a problem while it's still small and can be treated. You need to: Check your breasts often. Tell your doctor about any changes. You should do breast self-exams even if you have breast implants. What you need: A mirror. A well-lit room. A pillow or other soft object. How to do a breast self-exam Look for changes  Take off all the clothes above your waist. Stand in front of a mirror in a room with good lighting. Put your hands down at your sides. Compare your breasts in the mirror. Look for difference between them, such as: Differences in shape. Differences in size. Wrinkles, dips, and bumps in one breast and not the other. Look at each breast for skin changes, such as: Redness. Scaly spots. Spots where your skin is thicker. Dimpling. Open sores. Look for changes in your nipples, such as: Fluid coming out of a nipple. Fluid around a nipple. Bleeding. Dimpling. Redness. A nipple that looks pushed in or that has changed position. Feel for changes Lie on your back. Feel each breast. To do this: Pick a breast to feel. Place a pillow under the shoulder closest to that breast. Put the arm closest to that breast behind your head. Feel the breast using the hand of your other arm. Use the pads of your three middle fingers to make small circles starting near the nipple. Use light, medium, and firm pressure. Keep making circles, moving down over the breast. Stop when you feel your ribs. Start making circles with your fingers again, this time going up until you reach your collarbone. Then, make  circles out across your breast and into your armpit area. Squeeze your nipple. Check for fluid and lumps. Do these steps again to check your other breast. Sit or stand in the tub or shower. With soapy water on your skin, feel each breast the same way you did when you were lying down. Write down what you find Writing down what you find can help you keep track of what you want to tell your doctor. Write down: What's normal for each breast. Any changes you find. Write down: The kind of change. If your breast feels tender or painful. Any lump you find. Write down its size and where it is. When you last had your period. General tips If you're breastfeeding, the best time to check your breasts is after you feed your baby or after you use a breast pump. If you get a period, the best time to check your breasts is 5-7 days after your period ends. With time, you'll get more used to doing the self-exam. You'll also start to know if there are changes in your breasts. Contact a doctor if: You see a change in the shape or size of your breasts or nipples. You see a change in the skin of your breast or nipples. You have fluid coming from your nipples that isn't normal. You find a new lump or thick area. You have breast pain. You have any concerns about your breast health. This information is not intended to replace advice given to  you by your health care provider. Make sure you discuss any questions you have with your health care provider. Document Revised: 05/29/2023 Document Reviewed: 05/29/2023 Elsevier Patient Education  2025 [employer].

## 2024-01-13 NOTE — Progress Notes (Unsigned)
 Outpatient Surgical Follow Up  01/13/2024  Annette Bruce is an 73 y.o. female.   Chief Complaint  Patient presents with   Follow-up    HPI: Annette Bruce is a 73 yo female with hx of ALH after excisional biopsy Left breast by Dr. Dessa 2019. She denies any breast masses, DC or pain. Mammo pers. Reviewed showing no evidence of concerning lesions. SHe denies any fevers any chills any weight loss. She does have mother and sister hx breast CA and sister died at age 32.  She does have psoriatic arthritis and has been on biological rx w good results. SHe follows w Rheumatology She had a Zenker's Diverticulectomy in the recent past and feels well swallowing pills , f/u w [facility].    Past Medical History:  Diagnosis Date   Allergic rhinitis    Atypical lobular hyperplasia (ALH) of breast 01/20/2018   Colon polyps    GERD (gastroesophageal reflux disease)    History of hiatal hernia    SMALL PER PT   Hyperlipemia    Hypertension    Postmenopausal bleeding    Rheumatoid arthritis (HCC)    Dr Maryl   Vitamin D deficiency     Past Surgical History:  Procedure Laterality Date   APPENDECTOMY     BREAST BIOPSY Left 01/02/2018   x marker, LOQ, CYSTIC PAPILLARY APOCRINE METAPLASIA.    BREAST BIOPSY Left 01/02/2018   coil marker, UIQ, ATYPICAL LOBULAR HYPERPLASIA, CYSTIC PAPILLARY APOCRINE METAPLASIA   BREAST BIOPSY Left 01/20/2018   Procedure: BREAST BIOPSY;  Surgeon: Dessa Reyes ORN, MD;  Location: [facility] ORS;  Service: General;  Laterality: Left;   BREAST CYST ASPIRATION Left 1970's   negative   BREAST LUMPECTOMY Left 01/20/2018   Procedure: BREAST WIDE EXCISION;  Surgeon: Dessa Reyes ORN, MD;  Location: [facility] ORS;  Service: General;  Laterality: Left;   COLONOSCOPY N/A 03/13/2021   Procedure: COLONOSCOPY;  Surgeon: Onita Elspeth Sharper, DO;  Location: [facility] ENDOSCOPY;  Service: Gastroenterology;  Laterality: N/A;   COLONOSCOPY WITH PROPOFOL  N/A 12/16/2015   Procedure:  COLONOSCOPY WITH PROPOFOL ;  Surgeon: Gladis RAYMOND Mariner, MD;  Location: [facility] ENDOSCOPY;  Service: Endoscopy;  Laterality: N/A;   ESOPHAGOGASTRODUODENOSCOPY N/A 03/13/2021   Procedure: ESOPHAGOGASTRODUODENOSCOPY (EGD);  Surgeon: Onita Elspeth Sharper, DO;  Location: [facility] ENDOSCOPY;  Service: Gastroenterology;  Laterality: N/A;   ESOPHAGOGASTRODUODENOSCOPY (EGD) WITH PROPOFOL  N/A 12/16/2015   Procedure: ESOPHAGOGASTRODUODENOSCOPY (EGD) WITH PROPOFOL ;  Surgeon: Gladis RAYMOND Mariner, MD;  Location: [facility] ENDOSCOPY;  Service: Endoscopy;  Laterality: N/A;   ESOPHAGOGASTRODUODENOSCOPY (EGD) WITH PROPOFOL  N/A 04/05/2022   Procedure: ESOPHAGOGASTRODUODENOSCOPY (EGD) WITH PROPOFOL ;  Surgeon: Onita Elspeth Sharper, DO;  Location: [facility] ENDOSCOPY;  Service: Gastroenterology;  Laterality: N/A;   HYSTEROSCOPY     TONSILLECTOMY      Family History  Problem Relation Age of Onset   Colon cancer Mother 66   Breast cancer Sister 93   Breast cancer Maternal Aunt 20    Social History:  reports that she has never smoked. She has been exposed to tobacco smoke. She has never used smokeless tobacco. She reports that she does not drink alcohol and does not use drugs.  Allergies:  Allergies  Allergen Reactions   Doxycycline Hives    Medications reviewed.    ROS Full ROS performed and is otherwise negative other than what is stated in HPI   BP 137/84   Pulse 68   Temp 98.1 F (36.7 C) (Oral)   Ht 5' 9 (1.753 m)   Wt 264 lb (  119.7 kg)   SpO2 96%   BMI 38.99 kg/m   Physical Exam  Chaperone present Physical Exam Eyes:     General: No scleral icterus.       Right eye: No discharge.        Left eye: No discharge.     Extraocular Movements: Extraocular movements intact.  Neck:     Musculoskeletal: Normal range of motion and neck supple. No neck rigidity or muscular tenderness.  Cardiovascular:     Rate and Rhythm: Normal rate and regular rhythm.     Pulses: Normal pulses.  Pulmonary:     Effort:  Pulmonary effort is normal. No respiratory distress.     Breath sounds: No stridor. No wheezing.     Comments: BREAST: NO masses. nml nipple and skin. No axillary LAD. Previous lumpectomy scar on the left breast, no significant deformity Abdominal:     General: Abdomen is flat. There is no distension.     Palpations: There is no mass.     Tenderness: There is no abdominal tenderness.     Hernia: No hernia is present.  Lymphadenopathy:     Cervical: No cervical adenopathy.  Skin:    Capillary Refill: Capillary refill takes less than 2 seconds.  Neurological:     General: No focal deficit present.     Mental Status: She is oriented to person, place, and time.  Psychiatric:        Mood and Affect: Mood normal.        Behavior: Behavior normal.        Thought Content: Thought content normal.        Judgment: Judgment normal.    Assessment/Plan: 73 year old female with a history of left breast atypical lobular hyperplasia status post excisional biopsy in 2019.  Currently there is no evidence of any suspicious lesions.  We will continue yearly mammogram and physical exams. No need for further biopsies or w/u. All questions were answered.  I personally spent a total of 30 minutes in the care of the patient today including performing a medically appropriate exam/evaluation, counseling and educating, placing orders, referring and communicating with other health care professionals, documenting clinical information in the EHR, independently interpreting and reviewing images studies and coordinating care.   Laneta Luna, MD [facility] General Surgeon

## 2024-06-04 ENCOUNTER — Ambulatory Visit: Admit: 2024-06-04 | Admitting: Gastroenterology

## 2024-10-01 ENCOUNTER — Ambulatory Visit: Admitting: Dermatology
